# Patient Record
Sex: Female | Born: 1950
Health system: Southern US, Community
[De-identification: ages and names within clinical notes are randomized; demographics above are authoritative.]

## PROBLEM LIST (undated history)

## (undated) DIAGNOSIS — F40298 Other specified phobia: Secondary | ICD-10-CM

## (undated) DIAGNOSIS — D4701 Cutaneous mastocytosis: Secondary | ICD-10-CM

## (undated) DIAGNOSIS — C44722 Squamous cell carcinoma of skin of right lower limb, including hip: Secondary | ICD-10-CM

## (undated) HISTORY — DX: Other specified phobia: F40.298

## (undated) HISTORY — DX: Cutaneous mastocytosis: D47.01

## (undated) HISTORY — DX: Squamous cell carcinoma of skin of right lower limb, including hip: C44.722

---

## 2000-08-31 ENCOUNTER — Other Ambulatory Visit: Admission: RE | Admit: 2000-08-31 | Discharge: 2000-08-31 | Payer: Self-pay | Admitting: *Deleted

## 2002-01-04 ENCOUNTER — Other Ambulatory Visit: Admission: RE | Admit: 2002-01-04 | Discharge: 2002-01-04 | Payer: Self-pay | Admitting: *Deleted

## 2003-01-27 ENCOUNTER — Other Ambulatory Visit: Admission: RE | Admit: 2003-01-27 | Discharge: 2003-01-27 | Payer: Self-pay | Admitting: Family Medicine

## 2007-11-06 ENCOUNTER — Other Ambulatory Visit: Admission: RE | Admit: 2007-11-06 | Discharge: 2007-11-06 | Payer: Self-pay | Admitting: Obstetrics and Gynecology

## 2012-02-27 ENCOUNTER — Encounter: Payer: Self-pay | Admitting: Internal Medicine

## 2012-02-27 ENCOUNTER — Other Ambulatory Visit (INDEPENDENT_AMBULATORY_CARE_PROVIDER_SITE_OTHER): Payer: Self-pay

## 2012-02-27 ENCOUNTER — Ambulatory Visit (INDEPENDENT_AMBULATORY_CARE_PROVIDER_SITE_OTHER): Payer: Self-pay | Admitting: Internal Medicine

## 2012-02-27 VITALS — BP 120/84 | HR 61 | Temp 97.6°F | Resp 16 | Wt 113.0 lb

## 2012-02-27 DIAGNOSIS — Z23 Encounter for immunization: Secondary | ICD-10-CM

## 2012-02-27 DIAGNOSIS — Z1231 Encounter for screening mammogram for malignant neoplasm of breast: Secondary | ICD-10-CM | POA: Insufficient documentation

## 2012-02-27 DIAGNOSIS — Z0000 Encounter for general adult medical examination without abnormal findings: Secondary | ICD-10-CM | POA: Insufficient documentation

## 2012-02-27 DIAGNOSIS — IMO0002 Reserved for concepts with insufficient information to code with codable children: Secondary | ICD-10-CM

## 2012-02-27 LAB — COMPREHENSIVE METABOLIC PANEL
ALT: 13 U/L (ref 0–35)
Alkaline Phosphatase: 48 U/L (ref 39–117)
CO2: 30 mEq/L (ref 19–32)
Creatinine, Ser: 0.6 mg/dL (ref 0.4–1.2)
GFR: 113.32 mL/min (ref >60.00)
Total Bilirubin: 0.7 mg/dL (ref 0.3–1.2)

## 2012-02-27 LAB — CBC WITH DIFFERENTIAL/PLATELET
Basophils Absolute: 0 10*3/uL (ref 0.0–0.1)
Eosinophils Relative: 5.3 % — ABNORMAL HIGH (ref 0.0–5.0)
HCT: 39.4 % (ref 36.0–46.0)
Lymphocytes Relative: 22 % (ref 12.0–46.0)
Lymphs Abs: 1.3 10*3/uL (ref 0.7–4.0)
Monocytes Relative: 6.9 % (ref 3.0–12.0)
Neutrophils Relative %: 65 % (ref 43.0–77.0)
Platelets: 221 10*3/uL (ref 150.0–400.0)
RDW: 13.3 % (ref 11.5–14.6)
WBC: 6 10*3/uL (ref 4.5–10.5)

## 2012-02-27 LAB — LIPID PANEL
Cholesterol: 201 mg/dL — ABNORMAL HIGH (ref 0–200)
HDL: 88.5 mg/dL (ref >39.00)
Total CHOL/HDL Ratio: 2
Triglycerides: 37 mg/dL (ref 0.0–149.0)
VLDL: 7.4 mg/dL (ref 0.0–40.0)

## 2012-02-27 LAB — URINALYSIS, ROUTINE W REFLEX MICROSCOPIC
Bilirubin Urine: NEGATIVE
Leukocytes, UA: NEGATIVE
Nitrite: NEGATIVE
Total Protein, Urine: NEGATIVE
pH: 7 (ref 5.0–8.0)

## 2012-02-27 NOTE — Patient Instructions (Signed)
Preventive Care for Adults, Female A healthy lifestyle and preventive care can promote health and wellness. Preventive health guidelines for women include the following key practices.  A routine yearly physical is a good way to check with your caregiver about your health and preventive screening. It is a chance to share any concerns and updates on your health, and to receive a thorough exam.   Visit your dentist for a routine exam and preventive care every 6 months. Brush your teeth twice a day and floss once a day. Good oral hygiene prevents tooth decay and gum disease.   The frequency of eye exams is based on your age, health, family medical history, use of contact lenses, and other factors. Follow your caregiver's recommendations for frequency of eye exams.   Eat a healthy diet. Foods like vegetables, fruits, whole grains, low-fat dairy products, and lean protein foods contain the nutrients you need without too many calories. Decrease your intake of foods high in solid fats, added sugars, and salt. Eat the right amount of calories for you.Get information about a proper diet from your caregiver, if necessary.   Regular physical exercise is one of the most important things you can do for your health. Most adults should get at least 150 minutes of moderate-intensity exercise (any activity that increases your heart rate and causes you to sweat) each week. In addition, most adults need muscle-strengthening exercises on 2 or more days a week.   Maintain a healthy weight. The body mass index (BMI) is a screening tool to identify possible weight problems. It provides an estimate of body fat based on height and weight. Your caregiver can help determine your BMI, and can help you achieve or maintain a healthy weight.For adults 20 years and older:   A BMI below 18.5 is considered underweight.   A BMI of 18.5 to 24.9 is normal.   A BMI of 25 to 29.9 is considered overweight.   A BMI of 30 and above is  considered obese.   Maintain normal blood lipids and cholesterol levels by exercising and minimizing your intake of saturated fat. Eat a balanced diet with plenty of fruit and vegetables. Blood tests for lipids and cholesterol should begin at age 20 and be repeated every 5 years. If your lipid or cholesterol levels are high, you are over 50, or you are at high risk for heart disease, you may need your cholesterol levels checked more frequently.Ongoing high lipid and cholesterol levels should be treated with medicines if diet and exercise are not effective.   If you smoke, find out from your caregiver how to quit. If you do not use tobacco, do not start.   If you are pregnant, do not drink alcohol. If you are breastfeeding, be very cautious about drinking alcohol. If you are not pregnant and choose to drink alcohol, do not exceed 1 drink per day. One drink is considered to be 12 ounces (355 mL) of beer, 5 ounces (148 mL) of wine, or 1.5 ounces (44 mL) of liquor.   Avoid use of street drugs. Do not share needles with anyone. Ask for help if you need support or instructions about stopping the use of drugs.   High blood pressure causes heart disease and increases the risk of stroke. Your blood pressure should be checked at least every 1 to 2 years. Ongoing high blood pressure should be treated with medicines if weight loss and exercise are not effective.   If you are 55 to 59   years old, ask your caregiver if you should take aspirin to prevent strokes.   Diabetes screening involves taking a blood sample to check your fasting blood sugar level. This should be done once every 3 years, after age 45, if you are within normal weight and without risk factors for diabetes. Testing should be considered at a younger age or be carried out more frequently if you are overweight and have at least 1 risk factor for diabetes.   Breast cancer screening is essential preventive care for women. You should practice "breast  self-awareness." This means understanding the normal appearance and feel of your breasts and may include breast self-examination. Any changes detected, no matter how small, should be reported to a caregiver. Women in their 20s and 30s should have a clinical breast exam (CBE) by a caregiver as part of a regular health exam every 1 to 3 years. After age 40, women should have a CBE every year. Starting at age 40, women should consider having a mammography (breast X-ray test) every year. Women who have a family history of breast cancer should talk to their caregiver about genetic screening. Women at a high risk of breast cancer should talk to their caregivers about having magnetic resonance imaging (MRI) and a mammography every year.   The Pap test is a screening test for cervical cancer. A Pap test can show cell changes on the cervix that might become cervical cancer if left untreated. A Pap test is a procedure in which cells are obtained and examined from the lower end of the uterus (cervix).   Women should have a Pap test starting at age 21.   Between ages 21 and 29, Pap tests should be repeated every 2 years.   Beginning at age 30, you should have a Pap test every 3 years as long as the past 3 Pap tests have been normal.   Some women have medical problems that increase the chance of getting cervical cancer. Talk to your caregiver about these problems. It is especially important to talk to your caregiver if a new problem develops soon after your last Pap test. In these cases, your caregiver may recommend more frequent screening and Pap tests.   The above recommendations are the same for women who have or have not gotten the vaccine for human papillomavirus (HPV).   If you had a hysterectomy for a problem that was not cancer or a condition that could lead to cancer, then you no longer need Pap tests. Even if you no longer need a Pap test, a regular exam is a good idea to make sure no other problems are  starting.   If you are between ages 65 and 70, and you have had normal Pap tests going back 10 years, you no longer need Pap tests. Even if you no longer need a Pap test, a regular exam is a good idea to make sure no other problems are starting.   If you have had past treatment for cervical cancer or a condition that could lead to cancer, you need Pap tests and screening for cancer for at least 20 years after your treatment.   If Pap tests have been discontinued, risk factors (such as a new sexual partner) need to be reassessed to determine if screening should be resumed.   The HPV test is an additional test that may be used for cervical cancer screening. The HPV test looks for the virus that can cause the cell changes on the cervix.   The cells collected during the Pap test can be tested for HPV. The HPV test could be used to screen women aged 30 years and older, and should be used in women of any age who have unclear Pap test results. After the age of 30, women should have HPV testing at the same frequency as a Pap test.   Colorectal cancer can be detected and often prevented. Most routine colorectal cancer screening begins at the age of 50 and continues through age 75. However, your caregiver may recommend screening at an earlier age if you have risk factors for colon cancer. On a yearly basis, your caregiver may provide home test kits to check for hidden blood in the stool. Use of a small camera at the end of a tube, to directly examine the colon (sigmoidoscopy or colonoscopy), can detect the earliest forms of colorectal cancer. Talk to your caregiver about this at age 50, when routine screening begins. Direct examination of the colon should be repeated every 5 to 10 years through age 75, unless early forms of pre-cancerous polyps or small growths are found.   Hepatitis C blood testing is recommended for all people born from 1945 through 1965 and any individual with known risks for hepatitis C.    Practice safe sex. Use condoms and avoid high-risk sexual practices to reduce the spread of sexually transmitted infections (STIs). STIs include gonorrhea, chlamydia, syphilis, trichomonas, herpes, HPV, and human immunodeficiency virus (HIV). Herpes, HIV, and HPV are viral illnesses that have no cure. They can result in disability, cancer, and death. Sexually active women aged 25 and younger should be checked for chlamydia. Older women with new or multiple partners should also be tested for chlamydia. Testing for other STIs is recommended if you are sexually active and at increased risk.   Osteoporosis is a disease in which the bones lose minerals and strength with aging. This can result in serious bone fractures. The risk of osteoporosis can be identified using a bone density scan. Women ages 65 and over and women at risk for fractures or osteoporosis should discuss screening with their caregivers. Ask your caregiver whether you should take a calcium supplement or vitamin D to reduce the rate of osteoporosis.   Menopause can be associated with physical symptoms and risks. Hormone replacement therapy is available to decrease symptoms and risks. You should talk to your caregiver about whether hormone replacement therapy is right for you.   Use sunscreen with sun protection factor (SPF) of 30 or more. Apply sunscreen liberally and repeatedly throughout the day. You should seek shade when your shadow is shorter than you. Protect yourself by wearing long sleeves, pants, a wide-brimmed hat, and sunglasses year round, whenever you are outdoors.   Once a month, do a whole body skin exam, using a mirror to look at the skin on your back. Notify your caregiver of new moles, moles that have irregular borders, moles that are larger than a pencil eraser, or moles that have changed in shape or color.   Stay current with required immunizations.   Influenza. You need a dose every fall (or winter). The composition of  the flu vaccine changes each year, so being vaccinated once is not enough.   Pneumococcal polysaccharide. You need 1 to 2 doses if you smoke cigarettes or if you have certain chronic medical conditions. You need 1 dose at age 65 (or older) if you have never been vaccinated.   Tetanus, diphtheria, pertussis (Tdap, Td). Get 1 dose of   Tdap vaccine if you are younger than age 65, are over 65 and have contact with an infant, are a healthcare worker, are pregnant, or simply want to be protected from whooping cough. After that, you need a Td booster dose every 10 years. Consult your caregiver if you have not had at least 3 tetanus and diphtheria-containing shots sometime in your life or have a deep or dirty wound.   HPV. You need this vaccine if you are a woman age 26 or younger. The vaccine is given in 3 doses over 6 months.   Measles, mumps, rubella (MMR). You need at least 1 dose of MMR if you were born in 1957 or later. You may also need a second dose.   Meningococcal. If you are age 19 to 21 and a first-year college student living in a residence hall, or have one of several medical conditions, you need to get vaccinated against meningococcal disease. You may also need additional booster doses.   Zoster (shingles). If you are age 60 or older, you should get this vaccine.   Varicella (chickenpox). If you have never had chickenpox or you were vaccinated but received only 1 dose, talk to your caregiver to find out if you need this vaccine.   Hepatitis A. You need this vaccine if you have a specific risk factor for hepatitis A virus infection or you simply wish to be protected from this disease. The vaccine is usually given as 2 doses, 6 to 18 months apart.   Hepatitis B. You need this vaccine if you have a specific risk factor for hepatitis B virus infection or you simply wish to be protected from this disease. The vaccine is given in 3 doses, usually over 6 months.  Preventive Services /  Frequency Ages 19 to 39  Blood pressure check.** / Every 1 to 2 years.   Lipid and cholesterol check.** / Every 5 years beginning at age 20.   Clinical breast exam.** / Every 3 years for women in their 20s and 30s.   Pap test.** / Every 2 years from ages 21 through 29. Every 3 years starting at age 30 through age 65 or 70 with a history of 3 consecutive normal Pap tests.   HPV screening.** / Every 3 years from ages 30 through ages 65 to 70 with a history of 3 consecutive normal Pap tests.   Hepatitis C blood test.** / For any individual with known risks for hepatitis C.   Skin self-exam. / Monthly.   Influenza immunization.** / Every year.   Pneumococcal polysaccharide immunization.** / 1 to 2 doses if you smoke cigarettes or if you have certain chronic medical conditions.   Tetanus, diphtheria, pertussis (Tdap, Td) immunization. / A one-time dose of Tdap vaccine. After that, you need a Td booster dose every 10 years.   HPV immunization. / 3 doses over 6 months, if you are 26 and younger.   Measles, mumps, rubella (MMR) immunization. / You need at least 1 dose of MMR if you were born in 1957 or later. You may also need a second dose.   Meningococcal immunization. / 1 dose if you are age 19 to 21 and a first-year college student living in a residence hall, or have one of several medical conditions, you need to get vaccinated against meningococcal disease. You may also need additional booster doses.   Varicella immunization.** / Consult your caregiver.   Hepatitis A immunization.** / Consult your caregiver. 2 doses, 6 to 18 months   apart.   Hepatitis B immunization.** / Consult your caregiver. 3 doses usually over 6 months.  Ages 40 to 64  Blood pressure check.** / Every 1 to 2 years.   Lipid and cholesterol check.** / Every 5 years beginning at age 20.   Clinical breast exam.** / Every year after age 40.   Mammogram.** / Every year beginning at age 40 and continuing for as  long as you are in good health. Consult with your caregiver.   Pap test.** / Every 3 years starting at age 30 through age 65 or 70 with a history of 3 consecutive normal Pap tests.   HPV screening.** / Every 3 years from ages 30 through ages 65 to 70 with a history of 3 consecutive normal Pap tests.   Fecal occult blood test (FOBT) of stool. / Every year beginning at age 50 and continuing until age 75. You may not need to do this test if you get a colonoscopy every 10 years.   Flexible sigmoidoscopy or colonoscopy.** / Every 5 years for a flexible sigmoidoscopy or every 10 years for a colonoscopy beginning at age 50 and continuing until age 75.   Hepatitis C blood test.** / For all people born from 1945 through 1965 and any individual with known risks for hepatitis C.   Skin self-exam. / Monthly.   Influenza immunization.** / Every year.   Pneumococcal polysaccharide immunization.** / 1 to 2 doses if you smoke cigarettes or if you have certain chronic medical conditions.   Tetanus, diphtheria, pertussis (Tdap, Td) immunization.** / A one-time dose of Tdap vaccine. After that, you need a Td booster dose every 10 years.   Measles, mumps, rubella (MMR) immunization. / You need at least 1 dose of MMR if you were born in 1957 or later. You may also need a second dose.   Varicella immunization.** / Consult your caregiver.   Meningococcal immunization.** / Consult your caregiver.   Hepatitis A immunization.** / Consult your caregiver. 2 doses, 6 to 18 months apart.   Hepatitis B immunization.** / Consult your caregiver. 3 doses, usually over 6 months.  Ages 65 and over  Blood pressure check.** / Every 1 to 2 years.   Lipid and cholesterol check.** / Every 5 years beginning at age 20.   Clinical breast exam.** / Every year after age 40.   Mammogram.** / Every year beginning at age 40 and continuing for as long as you are in good health. Consult with your caregiver.   Pap test.** /  Every 3 years starting at age 30 through age 65 or 70 with a 3 consecutive normal Pap tests. Testing can be stopped between 65 and 70 with 3 consecutive normal Pap tests and no abnormal Pap or HPV tests in the past 10 years.   HPV screening.** / Every 3 years from ages 30 through ages 65 or 70 with a history of 3 consecutive normal Pap tests. Testing can be stopped between 65 and 70 with 3 consecutive normal Pap tests and no abnormal Pap or HPV tests in the past 10 years.   Fecal occult blood test (FOBT) of stool. / Every year beginning at age 50 and continuing until age 75. You may not need to do this test if you get a colonoscopy every 10 years.   Flexible sigmoidoscopy or colonoscopy.** / Every 5 years for a flexible sigmoidoscopy or every 10 years for a colonoscopy beginning at age 50 and continuing until age 75.   Hepatitis   C blood test.** / For all people born from 1945 through 1965 and any individual with known risks for hepatitis C.   Osteoporosis screening.** / A one-time screening for women ages 65 and over and women at risk for fractures or osteoporosis.   Skin self-exam. / Monthly.   Influenza immunization.** / Every year.   Pneumococcal polysaccharide immunization.** / 1 dose at age 65 (or older) if you have never been vaccinated.   Tetanus, diphtheria, pertussis (Tdap, Td) immunization. / A one-time dose of Tdap vaccine if you are over 65 and have contact with an infant, are a healthcare worker, or simply want to be protected from whooping cough. After that, you need a Td booster dose every 10 years.   Varicella immunization.** / Consult your caregiver.   Meningococcal immunization.** / Consult your caregiver.   Hepatitis A immunization.** / Consult your caregiver. 2 doses, 6 to 18 months apart.   Hepatitis B immunization.** / Check with your caregiver. 3 doses, usually over 6 months.  ** Family history and personal history of risk and conditions may change your caregiver's  recommendations. Document Released: 01/31/2002 Document Revised: 11/24/2011 Document Reviewed: 05/02/2011 [employer] Patient Information 2012 [employer]. 

## 2012-02-27 NOTE — Assessment & Plan Note (Signed)
Exam done, labs ordered, referred for mammogram and colonoscopy, vaccines were updated, pt ed material was given as well

## 2012-02-27 NOTE — Progress Notes (Signed)
  Subjective:    Patient ID: Erin Kim, female    DOB: Erin Kim, 61 y.o.   MRN: [id number]  HPI  New to me for a complete physical but her main complaint is that she has a cyst on her left index finger for nearly one year, she tells me that she saw a derm last summer and that it was drained but then recurred, 3 weeks ago she saw Dr. Darrelyn Hillock and he did xrays (results unknown to me) and he told her to treat it by "putting a band aid over it" but the cyst is bothersome and "gets in the way."  Review of Systems  Constitutional: Negative.   HENT: Negative.   Eyes: Negative.   Respiratory: Negative.   Cardiovascular: Negative.   Gastrointestinal: Negative.   Genitourinary: Negative.   Musculoskeletal: Negative.   Skin: Negative.   Neurological: Negative.   Hematological: Negative.   Psychiatric/Behavioral: Negative.        Objective:   Physical Exam  Vitals reviewed. Constitutional: She is oriented to person, place, and time. She appears well-developed and well-nourished. No distress.  HENT:  Head: Normocephalic and atraumatic.  Mouth/Throat: Oropharynx is clear and moist. No oropharyngeal exudate.  Eyes: Conjunctivae are normal. Right eye exhibits no discharge. Left eye exhibits no discharge. No scleral icterus.  Neck: Normal range of motion. Neck supple. No JVD present. No tracheal deviation present. No thyromegaly present.  Cardiovascular: Normal rate, regular rhythm, normal heart sounds and intact distal pulses.  Exam reveals no gallop and no friction rub.   No murmur heard. Pulmonary/Chest: Effort normal and breath sounds normal. No stridor. No respiratory distress. She has no wheezes. She has no rales. She exhibits no tenderness.  Abdominal: Soft. Bowel sounds are normal. She exhibits no distension and no mass. There is no tenderness. There is no rebound and no guarding.  Musculoskeletal: Normal range of motion. She exhibits no edema and no tenderness.        Hands: Lymphadenopathy:    She has no cervical adenopathy.  Neurological: She is oriented to person, place, and time.  Skin: Skin is warm and dry. No rash noted. She is not diaphoretic. No erythema. No pallor.  Psychiatric: She has a normal mood and affect. Her behavior is normal. Judgment and thought content normal.          Assessment & Plan:

## 2012-02-27 NOTE — Assessment & Plan Note (Signed)
She was referred to a hand surgeon

## 2012-02-29 ENCOUNTER — Encounter: Payer: Self-pay | Admitting: Internal Medicine

## 2012-02-29 ENCOUNTER — Encounter: Payer: Self-pay | Admitting: Gastroenterology

## 2012-03-14 ENCOUNTER — Ambulatory Visit (AMBULATORY_SURGERY_CENTER): Payer: Self-pay

## 2012-03-14 VITALS — Ht 62.0 in | Wt 113.0 lb

## 2012-03-14 DIAGNOSIS — Z1211 Encounter for screening for malignant neoplasm of colon: Secondary | ICD-10-CM

## 2012-03-14 DIAGNOSIS — Z800 Family history of malignant neoplasm of digestive organs: Secondary | ICD-10-CM

## 2012-03-14 MED ORDER — PEG-KCL-NACL-NASULF-NA ASC-C 100 G PO SOLR: 1.0000 | Freq: Once | ORAL | Status: DC

## 2012-03-23 ENCOUNTER — Ambulatory Visit (HOSPITAL_COMMUNITY): Payer: Self-pay

## 2012-03-26 LAB — HM COLONOSCOPY

## 2012-03-28 ENCOUNTER — Ambulatory Visit (AMBULATORY_SURGERY_CENTER): Payer: PRIVATE HEALTH INSURANCE | Admitting: Gastroenterology

## 2012-03-28 ENCOUNTER — Encounter: Payer: Self-pay | Admitting: Gastroenterology

## 2012-03-28 VITALS — BP 146/103 | HR 81 | Temp 96.0°F | Resp 24 | Ht 62.0 in | Wt 113.0 lb

## 2012-03-28 DIAGNOSIS — Z1211 Encounter for screening for malignant neoplasm of colon: Secondary | ICD-10-CM

## 2012-03-28 DIAGNOSIS — Z800 Family history of malignant neoplasm of digestive organs: Secondary | ICD-10-CM

## 2012-03-28 HISTORY — PX: COLONOSCOPY: SHX174

## 2012-03-28 MED ORDER — SODIUM CHLORIDE 0.9 % IV SOLN: 500.0000 mL | INTRAVENOUS | Status: DC

## 2012-03-28 NOTE — Op Note (Signed)
[facility] [address] [place], [place]  [address]  COLONOSCOPY PROCEDURE REPORT  PATIENT:  Erin Kim, Erin Kim  MR#:  [id number] BIRTHDATE:  05-10-1951, 60 yrs. old  GENDER:  female ENDOSCOPIST:  Vania Rea. Jarold Motto, MD, [facility] REF. BY:  Etta Grandchild, M.D. PROCEDURE DATE:  03/28/2012 PROCEDURE:  Higher-risk screening colonoscopy G0105  ASA CLASS:  Class I INDICATIONS:  family history of colon cancer MEDICATIONS:   propofol (Diprivan) 500 mg IV  DESCRIPTION OF PROCEDURE:   After the risks and benefits and of the procedure were explained, informed consent was obtained. Digital rectal exam was performed and revealed no abnormalities. The LB [id number] endoscope was introduced through the anus and advanced to the cecum, which was identified by both the appendix and ileocecal valve.  The quality of the prep was good, using MoviPrep.  The instrument was then slowly withdrawn as the colon was fully examined. <<PROCEDUREIMAGES>>  FINDINGS:  No polyps or cancers were seen.  This was otherwise a normal examination of the colon. VERY DIFFICULT EXAM PER REMARKEDLY LONG AND REDUNDANT COLON TO EXAMINE,,VERY DEEP PELVIC LOOPS,PEDIATRIC SCOPE USED,,,   Retroflexed views in the rectum revealed no abnormalities.    The scope was then withdrawn from the patient and the procedure completed.  COMPLICATIONS:  None ENDOSCOPIC IMPRESSION: 1) No polyps or cancers 2) Otherwise normal examination RECOMMENDATIONS: 1) Given your significant family history of colon cancer, you should have a repeat colonoscopy in 5 years  REPEAT EXAM:  No  ______________________________ Vania Rea. Jarold Motto, MD, [facility]  CC:  n. eSIGNED:   Vania Rea. Tahnee Cifuentes at 03/28/2012 10:53 AM  Nigel Mormon, [phone]

## 2012-03-28 NOTE — Progress Notes (Signed)
Patient did not experience any of the following events: a burn prior to discharge; a fall within the facility; wrong site/side/patient/procedure/implant event; or a hospital transfer or hospital admission upon discharge from the facility. (G8907) Patient did not have preoperative order for IV antibiotic SSI prophylaxis. (G8918)  

## 2012-03-28 NOTE — Patient Instructions (Signed)
YOU HAD AN ENDOSCOPIC PROCEDURE TODAY AT THE [facility]: Refer to the procedure report that was given to you for any specific questions about what was found during the examination.  If the procedure report does not answer your questions, please call your gastroenterologist to clarify.  If you requested that your care partner not be given the details of your procedure findings, then the procedure report has been included in a sealed envelope for you to review at your convenience later.  YOU SHOULD EXPECT: Some feelings of bloating in the abdomen. Passage of more gas than usual.  Walking can help get rid of the air that was put into your GI tract during the procedure and reduce the bloating. If you had a lower endoscopy (such as a colonoscopy or flexible sigmoidoscopy) you may notice spotting of blood in your stool or on the toilet paper. If you underwent a bowel prep for your procedure, then you may not have a normal bowel movement for a few days.  DIET: Your first meal following the procedure should be a light meal and then it is ok to progress to your normal diet.  A half-sandwich or bowl of soup is an example of a good first meal.  Heavy or fried foods are harder to digest and may make you feel nauseous or bloated.  Likewise meals heavy in dairy and vegetables can cause extra gas to form and this can also increase the bloating.  Drink plenty of fluids but you should avoid alcoholic beverages for 24 hours.  ACTIVITY: Your care partner should take you home directly after the procedure.  You should plan to take it easy, moving slowly for the rest of the day.  You can resume normal activity the day after the procedure however you should NOT DRIVE or use heavy machinery for 24 hours (because of the sedation medicines used during the test).    SYMPTOMS TO REPORT IMMEDIATELY: A gastroenterologist can be reached at any hour.  During normal business hours, 8:30 AM to 5:00 PM Monday through Friday,  call [phone].  After hours and on weekends, please call the GI answering service at [phone] who will take a message and have the physician on call contact you.   Following lower endoscopy (colonoscopy or flexible sigmoidoscopy):  Excessive amounts of blood in the stool  Significant tenderness or worsening of abdominal pains  Swelling of the abdomen that is new, acute  Fever of 100F or higher    FOLLOW UP: If any biopsies were taken you will be contacted by phone or by letter within the next 1-3 weeks.  Call your gastroenterologist if you have not heard about the biopsies in 3 weeks.  Our staff will call the home number listed on your records the next business day following your procedure to check on you and address any questions or concerns that you may have at that time regarding the information given to you following your procedure. This is a courtesy call and so if there is no answer at the home number and we have not heard from you through the emergency physician on call, we will assume that you have returned to your regular daily activities without incident.  SIGNATURES/CONFIDENTIALITY: You and/or your care partner have signed paperwork which will be entered into your electronic medical record.  These signatures attest to the fact that that the information above on your After Visit Summary has been reviewed and is understood.  Full responsibility of the confidentiality   of this discharge information lies with you and/or your care-partner.     

## 2012-03-28 NOTE — Progress Notes (Signed)
Difficulity reaching the cecum.  Pt was rolled supine.  Abdominal pressure by the tech and RN.  The cecum was reached.  Maw  The pt tolerated the colonoscopy very well. Maw

## 2012-03-29 ENCOUNTER — Telehealth: Payer: Self-pay

## 2012-03-29 NOTE — Telephone Encounter (Signed)
  Follow up Call-  Call back number 03/28/2012  Post procedure Call Back phone  # [phone]  Permission to leave phone message Yes     Patient questions:  Do you have a fever, pain , or abdominal swelling? no Pain Score  0 *  Have you tolerated food without any problems? yes  Have you been able to return to your normal activities? yes  Do you have any questions about your discharge instructions: Diet   no Medications  no Follow up visit  no  Do you have questions or concerns about your Care? no  Actions: * If pain score is 4 or above: No action needed, pain <4. Per the pt "everyone was so gracious, I was very fearful and will recommend [facility] to everyone". Maw

## 2012-04-16 ENCOUNTER — Ambulatory Visit (HOSPITAL_COMMUNITY)
Admission: RE | Admit: 2012-04-16 | Discharge: 2012-04-16 | Disposition: A | Discharge status: Home / Self Care | Payer: PRIVATE HEALTH INSURANCE | Source: Ambulatory Visit | Attending: Internal Medicine | Admitting: Internal Medicine

## 2012-04-16 DIAGNOSIS — Z1231 Encounter for screening mammogram for malignant neoplasm of breast: Secondary | ICD-10-CM | POA: Insufficient documentation

## 2012-04-18 LAB — HM MAMMOGRAPHY: HM Mammogram: NORMAL

## 2014-02-25 ENCOUNTER — Ambulatory Visit (INDEPENDENT_AMBULATORY_CARE_PROVIDER_SITE_OTHER): Payer: No Typology Code available for payment source | Admitting: Nurse Practitioner

## 2014-02-25 ENCOUNTER — Encounter: Payer: Self-pay | Admitting: Nurse Practitioner

## 2014-02-25 VITALS — BP 120/66 | HR 64 | Ht 63.0 in | Wt 109.0 lb

## 2014-02-25 DIAGNOSIS — N952 Postmenopausal atrophic vaginitis: Secondary | ICD-10-CM

## 2014-02-25 DIAGNOSIS — Z0000 Encounter for general adult medical examination without abnormal findings: Secondary | ICD-10-CM

## 2014-02-25 DIAGNOSIS — B3731 Acute candidiasis of vulva and vagina: Secondary | ICD-10-CM

## 2014-02-25 DIAGNOSIS — B373 Candidiasis of vulva and vagina: Secondary | ICD-10-CM

## 2014-02-25 DIAGNOSIS — Z01419 Encounter for gynecological examination (general) (routine) without abnormal findings: Secondary | ICD-10-CM

## 2014-02-25 LAB — POCT URINALYSIS DIPSTICK
BILIRUBIN UA: NEGATIVE
Blood, UA: NEGATIVE
Glucose, UA: NEGATIVE
KETONES UA: NEGATIVE
LEUKOCYTES UA: NEGATIVE
Nitrite, UA: NEGATIVE
PH UA: 6
PROTEIN UA: NEGATIVE
Urobilinogen, UA: NEGATIVE

## 2014-02-25 MED ORDER — FLUCONAZOLE 150 MG PO TABS: 150.0000 mg | ORAL_TABLET | Freq: Once | ORAL | Status: DC

## 2014-02-25 MED ORDER — ESTROGENS, CONJUGATED 0.625 MG/GM VA CREA: 1.0000 | TOPICAL_CREAM | Freq: Every day | VAGINAL | Status: DC

## 2014-02-25 NOTE — Progress Notes (Signed)
Patient ID: Erin Kim, female   DOB: 03-26-1951, 63 y.o.   MRN: [id number] 63 y.o. G50P2013 Married Caucasian Fe here for annual exam.  No vaso symptoms. Some increase in vaginal dryness.  Has used Premarin vaginal cream  In the past but is now out.  Wants to restart.  Using almond oil for lubrication but she feels this irritates here.  Patient's last menstrual period was 12/19/2001.          Sexually active: yes  The current method of family planning is post menopausal status.    Exercising: yes  Gym/ health club routine includes walking and zumba 2-3 times per week. Smoker:  no  Health Maintenance: Pap:  02/10/11, WNL MMG:  04/16/12, Bi-Rads 1: negative Colonoscopy:  03/28/12, normal, repeat in 5 years BMD:   2012 TDaP:  2013 Labs: HB: declined Urine: negative    reports that she quit smoking about 40 years ago. She has never used smokeless tobacco. She reports that she drinks about 8.4 ounces of alcohol per week. She reports that she does not use illicit drugs.  Past Medical History  Diagnosis Date  . Needle phobia     Past Surgical History  Procedure Laterality Date  . Cesarean section  1989    Current Outpatient Prescriptions  Medication Sig Dispense Refill  . Flaxseed, Linseed, (FLAX SEED OIL) 1000 MG CAPS Take 1 capsule by mouth daily.      . Multiple Vitamin (MULTIVITAMIN) tablet Take 1 tablet by mouth daily.       No current facility-administered medications for this visit.    Family History  Problem Relation Age of Onset  . Diabetes Mother   . Stroke Mother   . Hypertension Father   . Colon cancer Father   . Cancer Neg Hx   . Alcohol abuse Neg Hx   . Heart disease Neg Hx   . Hyperlipidemia Neg Hx   . Kidney disease Neg Hx   . Early death Neg Hx     ROS:  Pertinent items are noted in HPI.  Otherwise, a comprehensive ROS was negative.  Exam:   BP 120/66  Pulse 64  Ht 5\' 3"  (1.6 m)  Wt 109 lb (49.442 kg)  BMI 19.31 kg/m2  LMP 12/19/2001 Height: 5'  3" (160 cm)  Ht Readings from Last 3 Encounters:  02/25/14 5\' 3"  (1.6 m)  03/28/12 5\' 2"  (1.575 m)  03/14/12 5\' 2"  (1.575 m)    General appearance: alert, cooperative and appears stated age Head: Normocephalic, without obvious abnormality, atraumatic Neck: no adenopathy, supple, symmetrical, trachea midline and thyroid normal to inspection and palpation Lungs: clear to auscultation bilaterally Breasts: normal appearance, no masses or tenderness Heart: regular rate and rhythm Abdomen: soft, non-tender; no masses,  no organomegaly Extremities: extremities normal, atraumatic, no cyanosis or edema Skin: Skin color, texture, turgor normal. No rashes or lesions Lymph nodes: Cervical, supraclavicular, and axillary nodes normal. No abnormal inguinal nodes palpated Neurologic: Grossly normal   Pelvic: External genitalia:  no lesions              Urethra:  normal appearing urethra with no masses, tenderness or lesions              Bartholin's and Skene's: normal                 Vagina: very atrophic appearing vagina with pale color and discharge, no lesions  Cervix: anteverted              Pap taken: yes Bimanual Exam:  Uterus:  normal size, contour, position, consistency, mobility, non-tender              Adnexa: no mass, fullness, tenderness               Rectovaginal: Confirms               Anus:  normal sphincter tone, no lesions  A:  Well Woman with normal exam  Postmenopausal no HRT  Yeast external vaginitis  Atrophic vaginitis   P:   Pap smear as per guidelines   Mammogram due now and will schedule  Premarin Vaginal Cream 1`/2 gm three times weekly initially then reduce to twice weekly after 4-6 weeks.  Counseled with risk of CVA, DVT, cancer, etc.  Diflucan 150 mg X 2 given for yeast symptoms.  Recommend  OTC extra virgin olive oil prn  Counseled on breast self exam, mammography screening, adequate intake of calcium and vitamin D, diet and exercise return  annually or prn  An After Visit Summary was printed and given to the patient.

## 2014-02-25 NOTE — Patient Instructions (Addendum)

## 2014-02-27 LAB — IPS PAP TEST WITH HPV

## 2014-02-28 NOTE — Progress Notes (Signed)
Encounter reviewed by Dr. Savreen Gebhardt Silva.  

## 2014-03-04 ENCOUNTER — Ambulatory Visit: Payer: Self-pay | Admitting: Certified Nurse Midwife

## 2014-05-08 ENCOUNTER — Ambulatory Visit: Payer: PRIVATE HEALTH INSURANCE | Admitting: Internal Medicine

## 2014-06-02 ENCOUNTER — Telehealth: Payer: Self-pay | Admitting: Nurse Practitioner

## 2014-06-02 NOTE — Telephone Encounter (Signed)
Called the patient's husband back to explain claim remark code.

## 2014-06-02 NOTE — Telephone Encounter (Signed)
Patient's husband "Mikki Santee" is calling with a question about a bill they just received. He is questioning if the insurance was filed. Please call today if possible. He wants to get resolved today if possible. Release signed in chart, ok to talk with him.

## 2014-07-18 ENCOUNTER — Telehealth: Payer: Self-pay | Admitting: Nurse Practitioner

## 2014-07-18 NOTE — Telephone Encounter (Signed)
Left message to call Kaitlyn at [phone]. 

## 2014-07-18 NOTE — Telephone Encounter (Signed)
Patient calling re: She is visiting sister's newborn for a few days and her sister told her she needs  "whooping cough booster." Please advise.

## 2014-07-21 NOTE — Telephone Encounter (Signed)
Left message to call Kaitlyn at [phone]. 

## 2014-07-22 NOTE — Telephone Encounter (Signed)
Spoke with patient. Advised patient that she has received the Tdap vaccine in 2013. Advised the Tdap vaccine covers the pertussis. Advised that this vaccine already covers the pertussis portion and that she will not need another vaccine. Advised another tdap is due in 2023. Patient is agreeable and verbalizes understanding stating "My sisters husband said that I can't even come visit if I don't have it. Like I have to prove it or something." Patient states that she will talk with him about it and verbalizes understanding of vaccine received in 2013.  Routing to provider for final review. Patient agreeable to disposition. Will close encounter

## 2014-08-07 ENCOUNTER — Telehealth: Payer: Self-pay | Admitting: Nurse Practitioner

## 2014-08-07 NOTE — Telephone Encounter (Signed)
Patient's husband "Mikki Santee " is asking for Janett Billow to call today if she has a moment. He is asking if she has heard from his insurance regarding his wife's claim. He wants to know if you have talked with [employer] before he calls pathology regarding her claim. He says [employer] has paid partial on her pathology. He just wants an update, he says he really wants to get this resolved so that we can get payment.

## 2014-10-20 ENCOUNTER — Encounter: Payer: Self-pay | Admitting: Nurse Practitioner

## 2014-11-26 ENCOUNTER — Telehealth: Payer: Self-pay | Admitting: Nurse Practitioner

## 2014-11-26 NOTE — Telephone Encounter (Signed)
Please call regarding recent invoice .  ROI on file to talk with spouse "Mikki Santee'.

## 2015-03-02 ENCOUNTER — Ambulatory Visit: Payer: No Typology Code available for payment source | Admitting: Nurse Practitioner

## 2015-03-09 ENCOUNTER — Encounter: Payer: Self-pay | Admitting: Nurse Practitioner

## 2015-03-09 ENCOUNTER — Ambulatory Visit (INDEPENDENT_AMBULATORY_CARE_PROVIDER_SITE_OTHER): Payer: No Typology Code available for payment source | Admitting: Nurse Practitioner

## 2015-03-09 VITALS — BP 126/64 | HR 84 | Ht 63.0 in | Wt 112.0 lb

## 2015-03-09 DIAGNOSIS — Z0000 Encounter for general adult medical examination without abnormal findings: Secondary | ICD-10-CM | POA: Diagnosis not present

## 2015-03-09 DIAGNOSIS — Z01419 Encounter for gynecological examination (general) (routine) without abnormal findings: Secondary | ICD-10-CM | POA: Diagnosis not present

## 2015-03-09 LAB — POCT URINALYSIS DIPSTICK
BILIRUBIN UA: NEGATIVE
GLUCOSE UA: NEGATIVE
Ketones, UA: NEGATIVE
Nitrite, UA: NEGATIVE
PH UA: 6.5
PROTEIN UA: NEGATIVE
Urobilinogen, UA: NEGATIVE

## 2015-03-09 LAB — URINALYSIS, MICROSCOPIC ONLY
CASTS: NONE SEEN
Crystals: NONE SEEN

## 2015-03-09 LAB — HEMOGLOBIN, FINGERSTICK: Hemoglobin, fingerstick: 13.3 g/dL (ref 12.0–16.0)

## 2015-03-09 MED ORDER — ESTROGENS, CONJUGATED 0.625 MG/GM VA CREA: 1.0000 | TOPICAL_CREAM | Freq: Every day | VAGINAL | Status: DC

## 2015-03-09 NOTE — Patient Instructions (Signed)

## 2015-03-09 NOTE — Progress Notes (Signed)
Patient ID: Erin Kim, female   DOB: [birth year], 64 y.o.   MRN: [id number] 64 y.o. G48P2013 Married  Caucasian Fe here for annual exam.  No vaginal bleeding, some dryness.  Using coconut oil.  No other new diagnosis.  Patient's last menstrual period was 12/19/2001.          Sexually active: Yes.    The current method of family planning is post menopausal status.    Exercising: Yes.    walking, zumba and lightweights daily Smoker:  no  Health Maintenance: Pap:  02/25/14, negative with neg HR HPV MMG:  04/16/12, Bi-Rads 1:  Negative will schedule Colonoscopy:  03/28/12, normal, repeat in 5 years BMD:   Never Will schedule TDaP:  2013 Labs:  HB:  13.3  Urine:  Trace leuk's, trace RBC   reports that she quit smoking about 41 years ago. She has never used smokeless tobacco. She reports that she drinks about 3.6 oz of alcohol per week. She reports that she does not use illicit drugs.  Past Medical History  Diagnosis Date  . Needle phobia     Past Surgical History  Procedure Laterality Date  . Cesarean section  1989    Current Outpatient Prescriptions  Medication Sig Dispense Refill  . conjugated estrogens (PREMARIN) vaginal cream Place 1 Applicatorful vaginally daily. Use 1/2 g vaginally three times weekly 60 g 3  . Flaxseed, Linseed, (FLAX SEED OIL) 1000 MG CAPS Take 1 capsule by mouth daily.     No current facility-administered medications for this visit.    Family History  Problem Relation Age of Onset  . Diabetes Mother   . Stroke Mother   . Hypertension Father   . Colon cancer Father   . Cancer Neg Hx   . Alcohol abuse Neg Hx   . Heart disease Neg Hx   . Hyperlipidemia Neg Hx   . Kidney disease Neg Hx   . Early death Neg Hx     ROS:  Pertinent items are noted in HPI.  Otherwise, a comprehensive ROS was negative.  Exam:   BP 126/64 mmHg  Pulse 84  Ht 5\' 3"  (1.6 m)  Wt 112 lb (50.803 kg)  BMI 19.84 kg/m2  LMP 12/19/2001 Height: 5\' 3"  (160 cm) Ht Readings  from Last 3 Encounters:  03/09/15 5\' 3"  (1.6 m)  02/25/14 5\' 3"  (1.6 m)  03/28/12 5\' 2"  (1.575 m)    General appearance: alert, cooperative and appears stated age Head: Normocephalic, without obvious abnormality, atraumatic Neck: no adenopathy, supple, symmetrical, trachea midline and thyroid normal to inspection and palpation Lungs: clear to auscultation bilaterally Breasts: normal appearance, no masses or tenderness Heart: regular rate and rhythm Abdomen: soft, non-tender; no masses,  no organomegaly Extremities: extremities normal, atraumatic, no cyanosis or edema Skin: Skin color, texture, turgor normal. No rashes or lesions Lymph nodes: Cervical, supraclavicular, and axillary nodes normal. No abnormal inguinal nodes palpated Neurologic: Grossly normal   Pelvic: External genitalia:  no lesions              Urethra:  normal appearing urethra with no masses, tenderness or lesions              Bartholin's and Skene's: normal                 Vagina: normal appearing vagina with normal color and discharge, no lesions              Cervix: anteverted  Pap taken: No. Bimanual Exam:  Uterus:  normal size, contour, position, consistency, mobility, non-tender              Adnexa: no mass, fullness, tenderness               Rectovaginal: Confirms               Anus:  normal sphincter tone, no lesions  Chaperone present:  no  A:  Well Woman with normal exam  Postmenopausal no HRT psoriatic  external vaginitis Atrophic vaginitis - currently off vaginal estrogen  R/O UTI  P:   Reviewed health and wellness pertinent to exam  Pap smear not taken today  Mammogram is past due and will schedule  Premarin will not be refilled until Mammo is done  Order for BMD is sent to [facility].  Counseled on breast self exam, mammography screening, use and side effects of HRT, adequate intake of calcium and vitamin D, diet and exercise return annually or prn  An After  Visit Summary was printed and given to the patient.

## 2015-03-10 ENCOUNTER — Other Ambulatory Visit: Payer: Self-pay | Admitting: Nurse Practitioner

## 2015-03-10 LAB — URINE CULTURE

## 2015-03-10 MED ORDER — AMOXICILLIN 500 MG PO CAPS: 500.0000 mg | ORAL_CAPSULE | Freq: Three times a day (TID) | ORAL | Status: DC

## 2015-03-12 ENCOUNTER — Telehealth: Payer: Self-pay | Admitting: *Deleted

## 2015-03-12 NOTE — Progress Notes (Signed)
Encounter reviewed by Dr. Brook Silva.  

## 2015-03-12 NOTE — Telephone Encounter (Signed)
have attempted to contact this patient by phone with the following results: left message to return call to [place] at [phone] answering machine (home and cell). No personal information given. [phone] (Home) *Preferred*, [phone] (Mobile)

## 2015-03-12 NOTE — Telephone Encounter (Signed)
Pt notified in result note.  Closing encounter. 

## 2015-03-12 NOTE — Telephone Encounter (Signed)
-----   Message from Antonietta Barcelona, [place] sent at 03/10/2015  4:06 PM EDT ----- Please let pt. kno wthat urine shows group B strep.  She needs Amoxil 500 mg TID for 10 days.   RX put in Epic.

## 2015-03-16 ENCOUNTER — Encounter: Payer: Self-pay | Admitting: Internal Medicine

## 2015-03-16 ENCOUNTER — Ambulatory Visit (INDEPENDENT_AMBULATORY_CARE_PROVIDER_SITE_OTHER): Payer: PRIVATE HEALTH INSURANCE | Admitting: Internal Medicine

## 2015-03-16 ENCOUNTER — Encounter (INDEPENDENT_AMBULATORY_CARE_PROVIDER_SITE_OTHER): Payer: PRIVATE HEALTH INSURANCE

## 2015-03-16 VITALS — BP 128/76 | HR 72 | Temp 98.5°F | Resp 16 | Ht 63.0 in | Wt 112.0 lb

## 2015-03-16 DIAGNOSIS — Z1231 Encounter for screening mammogram for malignant neoplasm of breast: Secondary | ICD-10-CM

## 2015-03-16 DIAGNOSIS — Z23 Encounter for immunization: Secondary | ICD-10-CM

## 2015-03-16 DIAGNOSIS — Z0000 Encounter for general adult medical examination without abnormal findings: Secondary | ICD-10-CM | POA: Diagnosis not present

## 2015-03-16 DIAGNOSIS — M8580 Other specified disorders of bone density and structure, unspecified site: Secondary | ICD-10-CM

## 2015-03-16 DIAGNOSIS — I781 Nevus, non-neoplastic: Secondary | ICD-10-CM

## 2015-03-16 LAB — CBC WITH DIFFERENTIAL/PLATELET
BASOS PCT: 0.8 % (ref 0.0–3.0)
Basophils Absolute: 0.1 10*3/uL (ref 0.0–0.1)
EOS PCT: 4.3 % (ref 0.0–5.0)
Eosinophils Absolute: 0.3 10*3/uL (ref 0.0–0.7)
HEMATOCRIT: 40.4 % (ref 36.0–46.0)
Hemoglobin: 13.5 g/dL (ref 12.0–15.0)
LYMPHS PCT: 26.6 % (ref 12.0–46.0)
Lymphs Abs: 1.8 10*3/uL (ref 0.7–4.0)
MCHC: 33.5 g/dL (ref 30.0–36.0)
MCV: 89.4 fl (ref 78.0–100.0)
Monocytes Absolute: 0.6 10*3/uL (ref 0.1–1.0)
Monocytes Relative: 8.5 % (ref 3.0–12.0)
NEUTROS PCT: 59.8 % (ref 43.0–77.0)
Neutro Abs: 4 10*3/uL (ref 1.4–7.7)
PLATELETS: 217 10*3/uL (ref 150.0–400.0)
RBC: 4.52 Mil/uL (ref 3.87–5.11)
RDW: 13.5 % (ref 11.5–15.5)
WBC: 6.7 10*3/uL (ref 4.0–10.5)

## 2015-03-16 LAB — LIPID PANEL
CHOL/HDL RATIO: 2
CHOLESTEROL: 185 mg/dL (ref 0–200)
HDL: 82.9 mg/dL (ref >39.00)
LDL CALC: 89 mg/dL (ref 0–99)
NonHDL: 102.1
Triglycerides: 68 mg/dL (ref 0.0–149.0)
VLDL: 13.6 mg/dL (ref 0.0–40.0)

## 2015-03-16 LAB — COMPREHENSIVE METABOLIC PANEL
ALBUMIN: 4.1 g/dL (ref 3.5–5.2)
ALK PHOS: 45 U/L (ref 39–117)
ALT: 20 U/L (ref 0–35)
AST: 20 U/L (ref 0–37)
BUN: 10 mg/dL (ref 6–23)
CO2: 32 mEq/L (ref 19–32)
Calcium: 9.2 mg/dL (ref 8.4–10.5)
Chloride: 102 mEq/L (ref 96–112)
Creatinine, Ser: 0.7 mg/dL (ref 0.40–1.20)
GFR: 90.27 mL/min (ref >60.00)
GLUCOSE: 97 mg/dL (ref 70–99)
Potassium: 4.1 mEq/L (ref 3.5–5.1)
Sodium: 136 mEq/L (ref 135–145)
Total Bilirubin: 0.8 mg/dL (ref 0.2–1.2)
Total Protein: 6.9 g/dL (ref 6.0–8.3)

## 2015-03-16 LAB — TSH: TSH: 1.59 u[IU]/mL (ref 0.35–4.50)

## 2015-03-16 NOTE — Progress Notes (Signed)
Pre visit review using our clinic review tool, if applicable. No additional management support is needed unless otherwise documented below in the visit note. 

## 2015-03-16 NOTE — Patient Instructions (Signed)
Preventive Care for Adults A healthy lifestyle and preventive care can promote health and wellness. Preventive health guidelines for women include the following key practices.  A routine yearly physical is a good way to check with your health care provider about your health and preventive screening. It is a chance to share any concerns and updates on your health and to receive a thorough exam.  Visit your dentist for a routine exam and preventive care every 6 months. Brush your teeth twice a day and floss once a day. Good oral hygiene prevents tooth decay and gum disease.  The frequency of eye exams is based on your age, health, family medical history, use of contact lenses, and other factors. Follow your health care provider's recommendations for frequency of eye exams.  Eat a healthy diet. Foods like vegetables, fruits, whole grains, low-fat dairy products, and lean protein foods contain the nutrients you need without too many calories. Decrease your intake of foods high in solid fats, added sugars, and salt. Eat the right amount of calories for you.Get information about a proper diet from your health care provider, if necessary.  Regular physical exercise is one of the most important things you can do for your health. Most adults should get at least 150 minutes of moderate-intensity exercise (any activity that increases your heart rate and causes you to sweat) each week. In addition, most adults need muscle-strengthening exercises on 2 or more days a week.  Maintain a healthy weight. The body mass index (BMI) is a screening tool to identify possible weight problems. It provides an estimate of body fat based on height and weight. Your health care provider can find your BMI and can help you achieve or maintain a healthy weight.For adults 20 years and older:  A BMI below 18.5 is considered underweight.  A BMI of 18.5 to 24.9 is normal.  A BMI of 25 to 29.9 is considered overweight.  A BMI of  30 and above is considered obese.  Maintain normal blood lipids and cholesterol levels by exercising and minimizing your intake of saturated fat. Eat a balanced diet with plenty of fruit and vegetables. Blood tests for lipids and cholesterol should begin at age 76 and be repeated every 5 years. If your lipid or cholesterol levels are high, you are over 50, or you are at high risk for heart disease, you may need your cholesterol levels checked more frequently.Ongoing high lipid and cholesterol levels should be treated with medicines if diet and exercise are not working.  If you smoke, find out from your health care provider how to quit. If you do not use tobacco, do not start.  Lung cancer screening is recommended for adults aged 22-80 years who are at high risk for developing lung cancer because of a history of smoking. A yearly low-dose CT scan of the lungs is recommended for people who have at least a 30-pack-year history of smoking and are a current smoker or have quit within the past 15 years. A pack year of smoking is smoking an average of 1 pack of cigarettes a day for 1 year (for example: 1 pack a day for 30 years or 2 packs a day for 15 years). Yearly screening should continue until the smoker has stopped smoking for at least 15 years. Yearly screening should be stopped for people who develop a health problem that would prevent them from having lung cancer treatment.  If you are pregnant, do not drink alcohol. If you are breastfeeding,  be very cautious about drinking alcohol. If you are not pregnant and choose to drink alcohol, do not have more than 1 drink per day. One drink is considered to be 12 ounces (355 mL) of beer, 5 ounces (148 mL) of wine, or 1.5 ounces (44 mL) of liquor.  Avoid use of street drugs. Do not share needles with anyone. Ask for help if you need support or instructions about stopping the use of drugs.  High blood pressure causes heart disease and increases the risk of  stroke. Your blood pressure should be checked at least every 1 to 2 years. Ongoing high blood pressure should be treated with medicines if weight loss and exercise do not work.  If you are 75-52 years old, ask your health care provider if you should take aspirin to prevent strokes.  Diabetes screening involves taking a blood sample to check your fasting blood sugar level. This should be done once every 3 years, after age 15, if you are within normal weight and without risk factors for diabetes. Testing should be considered at a younger age or be carried out more frequently if you are overweight and have at least 1 risk factor for diabetes.  Breast cancer screening is essential preventive care for women. You should practice "breast self-awareness." This means understanding the normal appearance and feel of your breasts and may include breast self-examination. Any changes detected, no matter how small, should be reported to a health care provider. Women in their 58s and 30s should have a clinical breast exam (CBE) by a health care provider as part of a regular health exam every 1 to 3 years. After age 16, women should have a CBE every year. Starting at age 53, women should consider having a mammogram (breast X-ray test) every year. Women who have a family history of breast cancer should talk to their health care provider about genetic screening. Women at a high risk of breast cancer should talk to their health care providers about having an MRI and a mammogram every year.  Breast cancer gene (BRCA)-related cancer risk assessment is recommended for women who have family members with BRCA-related cancers. BRCA-related cancers include breast, ovarian, tubal, and peritoneal cancers. Having family members with these cancers may be associated with an increased risk for harmful changes (mutations) in the breast cancer genes BRCA1 and BRCA2. Results of the assessment will determine the need for genetic counseling and  BRCA1 and BRCA2 testing.  Routine pelvic exams to screen for cancer are no longer recommended for nonpregnant women who are considered low risk for cancer of the pelvic organs (ovaries, uterus, and vagina) and who do not have symptoms. Ask your health care provider if a screening pelvic exam is right for you.  If you have had past treatment for cervical cancer or a condition that could lead to cancer, you need Pap tests and screening for cancer for at least 20 years after your treatment. If Pap tests have been discontinued, your risk factors (such as having a new sexual partner) need to be reassessed to determine if screening should be resumed. Some women have medical problems that increase the chance of getting cervical cancer. In these cases, your health care provider may recommend more frequent screening and Pap tests.  The HPV test is an additional test that may be used for cervical cancer screening. The HPV test looks for the virus that can cause the cell changes on the cervix. The cells collected during the Pap test can be  tested for HPV. The HPV test could be used to screen women aged 30 years and older, and should be used in women of any age who have unclear Pap test results. After the age of 30, women should have HPV testing at the same frequency as a Pap test.  Colorectal cancer can be detected and often prevented. Most routine colorectal cancer screening begins at the age of 50 years and continues through age 75 years. However, your health care provider may recommend screening at an earlier age if you have risk factors for colon cancer. On a yearly basis, your health care provider may provide home test kits to check for hidden blood in the stool. Use of a small camera at the end of a tube, to directly examine the colon (sigmoidoscopy or colonoscopy), can detect the earliest forms of colorectal cancer. Talk to your health care provider about this at age 50, when routine screening begins. Direct  exam of the colon should be repeated every 5-10 years through age 75 years, unless early forms of pre-cancerous polyps or small growths are found.  People who are at an increased risk for hepatitis B should be screened for this virus. You are considered at high risk for hepatitis B if:  You were born in a country where hepatitis B occurs often. Talk with your health care provider about which countries are considered high risk.  Your parents were born in a high-risk country and you have not received a shot to protect against hepatitis B (hepatitis B vaccine).  You have HIV or AIDS.  You use needles to inject street drugs.  You live with, or have sex with, someone who has hepatitis B.  You get hemodialysis treatment.  You take certain medicines for conditions like cancer, organ transplantation, and autoimmune conditions.  Hepatitis C blood testing is recommended for all people born from 1945 through 1965 and any individual with known risks for hepatitis C.  Practice safe sex. Use condoms and avoid high-risk sexual practices to reduce the spread of sexually transmitted infections (STIs). STIs include gonorrhea, chlamydia, syphilis, trichomonas, herpes, HPV, and human immunodeficiency virus (HIV). Herpes, HIV, and HPV are viral illnesses that have no cure. They can result in disability, cancer, and death.  You should be screened for sexually transmitted illnesses (STIs) including gonorrhea and chlamydia if:  You are sexually active and are younger than 24 years.  You are older than 24 years and your health care provider tells you that you are at risk for this type of infection.  Your sexual activity has changed since you were last screened and you are at an increased risk for chlamydia or gonorrhea. Ask your health care provider if you are at risk.  If you are at risk of being infected with HIV, it is recommended that you take a prescription medicine daily to prevent HIV infection. This is  called preexposure prophylaxis (PrEP). You are considered at risk if:  You are a heterosexual woman, are sexually active, and are at increased risk for HIV infection.  You take drugs by injection.  You are sexually active with a partner who has HIV.  Talk with your health care provider about whether you are at high risk of being infected with HIV. If you choose to begin PrEP, you should first be tested for HIV. You should then be tested every 3 months for as long as you are taking PrEP.  Osteoporosis is a disease in which the bones lose minerals and strength   with aging. This can result in serious bone fractures or breaks. The risk of osteoporosis can be identified using a bone density scan. Women ages 65 years and over and women at risk for fractures or osteoporosis should discuss screening with their health care providers. Ask your health care provider whether you should take a calcium supplement or vitamin D to reduce the rate of osteoporosis.  Menopause can be associated with physical symptoms and risks. Hormone replacement therapy is available to decrease symptoms and risks. You should talk to your health care provider about whether hormone replacement therapy is right for you.  Use sunscreen. Apply sunscreen liberally and repeatedly throughout the day. You should seek shade when your shadow is shorter than you. Protect yourself by wearing long sleeves, pants, a wide-brimmed hat, and sunglasses year round, whenever you are outdoors.  Once a month, do a whole body skin exam, using a mirror to look at the skin on your back. Tell your health care provider of new moles, moles that have irregular borders, moles that are larger than a pencil eraser, or moles that have changed in shape or color.  Stay current with required vaccines (immunizations).  Influenza vaccine. All adults should be immunized every year.  Tetanus, diphtheria, and acellular pertussis (Td, Tdap) vaccine. Pregnant women should  receive 1 dose of Tdap vaccine during each pregnancy. The dose should be obtained regardless of the length of time since the last dose. Immunization is preferred during the 27th-36th week of gestation. An adult who has not previously received Tdap or who does not know her vaccine status should receive 1 dose of Tdap. This initial dose should be followed by tetanus and diphtheria toxoids (Td) booster doses every 10 years. Adults with an unknown or incomplete history of completing a 3-dose immunization series with Td-containing vaccines should begin or complete a primary immunization series including a Tdap dose. Adults should receive a Td booster every 10 years.  Varicella vaccine. An adult without evidence of immunity to varicella should receive 2 doses or a second dose if she has previously received 1 dose. Pregnant females who do not have evidence of immunity should receive the first dose after pregnancy. This first dose should be obtained before leaving the health care facility. The second dose should be obtained 4-8 weeks after the first dose.  Human papillomavirus (HPV) vaccine. Females aged 13-26 years who have not received the vaccine previously should obtain the 3-dose series. The vaccine is not recommended for use in pregnant females. However, pregnancy testing is not needed before receiving a dose. If a female is found to be pregnant after receiving a dose, no treatment is needed. In that case, the remaining doses should be delayed until after the pregnancy. Immunization is recommended for any person with an immunocompromised condition through the age of 26 years if she did not get any or all doses earlier. During the 3-dose series, the second dose should be obtained 4-8 weeks after the first dose. The third dose should be obtained 24 weeks after the first dose and 16 weeks after the second dose.  Zoster vaccine. One dose is recommended for adults aged 60 years or older unless certain conditions are  present.  Measles, mumps, and rubella (MMR) vaccine. Adults born before 1957 generally are considered immune to measles and mumps. Adults born in 1957 or later should have 1 or more doses of MMR vaccine unless there is a contraindication to the vaccine or there is laboratory evidence of immunity to   each of the three diseases. A routine second dose of MMR vaccine should be obtained at least 28 days after the first dose for students attending postsecondary schools, health care workers, or international travelers. People who received inactivated measles vaccine or an unknown type of measles vaccine during 1963-1967 should receive 2 doses of MMR vaccine. People who received inactivated mumps vaccine or an unknown type of mumps vaccine before 1979 and are at high risk for mumps infection should consider immunization with 2 doses of MMR vaccine. For females of childbearing age, rubella immunity should be determined. If there is no evidence of immunity, females who are not pregnant should be vaccinated. If there is no evidence of immunity, females who are pregnant should delay immunization until after pregnancy. Unvaccinated health care workers born before 1957 who lack laboratory evidence of measles, mumps, or rubella immunity or laboratory confirmation of disease should consider measles and mumps immunization with 2 doses of MMR vaccine or rubella immunization with 1 dose of MMR vaccine.  Pneumococcal 13-valent conjugate (PCV13) vaccine. When indicated, a person who is uncertain of her immunization history and has no record of immunization should receive the PCV13 vaccine. An adult aged 19 years or older who has certain medical conditions and has not been previously immunized should receive 1 dose of PCV13 vaccine. This PCV13 should be followed with a dose of pneumococcal polysaccharide (PPSV23) vaccine. The PPSV23 vaccine dose should be obtained at least 8 weeks after the dose of PCV13 vaccine. An adult aged 19  years or older who has certain medical conditions and previously received 1 or more doses of PPSV23 vaccine should receive 1 dose of PCV13. The PCV13 vaccine dose should be obtained 1 or more years after the last PPSV23 vaccine dose.  Pneumococcal polysaccharide (PPSV23) vaccine. When PCV13 is also indicated, PCV13 should be obtained first. All adults aged 65 years and older should be immunized. An adult younger than age 65 years who has certain medical conditions should be immunized. Any person who resides in a nursing home or long-term care facility should be immunized. An adult smoker should be immunized. People with an immunocompromised condition and certain other conditions should receive both PCV13 and PPSV23 vaccines. People with human immunodeficiency virus (HIV) infection should be immunized as soon as possible after diagnosis. Immunization during chemotherapy or radiation therapy should be avoided. Routine use of PPSV23 vaccine is not recommended for American Indians, [place] Natives, or people younger than 65 years unless there are medical conditions that require PPSV23 vaccine. When indicated, people who have unknown immunization and have no record of immunization should receive PPSV23 vaccine. One-time revaccination 5 years after the first dose of PPSV23 is recommended for people aged 19-64 years who have chronic kidney failure, nephrotic syndrome, asplenia, or immunocompromised conditions. People who received 1-2 doses of PPSV23 before age 65 years should receive another dose of PPSV23 vaccine at age 65 years or later if at least 5 years have passed since the previous dose. Doses of PPSV23 are not needed for people immunized with PPSV23 at or after age 65 years.  Meningococcal vaccine. Adults with asplenia or persistent complement component deficiencies should receive 2 doses of quadrivalent meningococcal conjugate (MenACWY-D) vaccine. The doses should be obtained at least 2 months apart.  Microbiologists working with certain meningococcal bacteria, military recruits, people at risk during an outbreak, and people who travel to or live in countries with a high rate of meningitis should be immunized. A first-year college student up through age   21 years who is living in a residence hall should receive a dose if she did not receive a dose on or after her 16th birthday. Adults who have certain high-risk conditions should receive one or more doses of vaccine.  Hepatitis A vaccine. Adults who wish to be protected from this disease, have certain high-risk conditions, work with hepatitis A-infected animals, work in hepatitis A research labs, or travel to or work in countries with a high rate of hepatitis A should be immunized. Adults who were previously unvaccinated and who anticipate close contact with an international adoptee during the first 60 days after arrival in the [place] from a country with a high rate of hepatitis A should be immunized.  Hepatitis B vaccine. Adults who wish to be protected from this disease, have certain high-risk conditions, may be exposed to blood or other infectious body fluids, are household contacts or sex partners of hepatitis B positive people, are clients or workers in certain care facilities, or travel to or work in countries with a high rate of hepatitis B should be immunized.  Haemophilus influenzae type b (Hib) vaccine. A previously unvaccinated person with asplenia or sickle cell disease or having a scheduled splenectomy should receive 1 dose of Hib vaccine. Regardless of previous immunization, a recipient of a hematopoietic stem cell transplant should receive a 3-dose series 6-12 months after her successful transplant. Hib vaccine is not recommended for adults with HIV infection. Preventive Services / Frequency Ages 64 to 68 years  Blood pressure check.** / Every 1 to 2 years.  Lipid and cholesterol check.** / Every 5 years beginning at age  22.  Clinical breast exam.** / Every 3 years for women in their 88s and 53s.  BRCA-related cancer risk assessment.** / For women who have family members with a BRCA-related cancer (breast, ovarian, tubal, or peritoneal cancers).  Pap test.** / Every 2 years from ages 90 through 51. Every 3 years starting at age 21 through age 56 or 3 with a history of 3 consecutive normal Pap tests.  HPV screening.** / Every 3 years from ages 24 through ages 1 to 46 with a history of 3 consecutive normal Pap tests.  Hepatitis C blood test.** / For any individual with known risks for hepatitis C.  Skin self-exam. / Monthly.  Influenza vaccine. / Every year.  Tetanus, diphtheria, and acellular pertussis (Tdap, Td) vaccine.** / Consult your health care provider. Pregnant women should receive 1 dose of Tdap vaccine during each pregnancy. 1 dose of Td every 10 years.  Varicella vaccine.** / Consult your health care provider. Pregnant females who do not have evidence of immunity should receive the first dose after pregnancy.  HPV vaccine. / 3 doses over 6 months, if 72 and younger. The vaccine is not recommended for use in pregnant females. However, pregnancy testing is not needed before receiving a dose.  Measles, mumps, rubella (MMR) vaccine.** / You need at least 1 dose of MMR if you were born in 1957 or later. You may also need a 2nd dose. For females of childbearing age, rubella immunity should be determined. If there is no evidence of immunity, females who are not pregnant should be vaccinated. If there is no evidence of immunity, females who are pregnant should delay immunization until after pregnancy.  Pneumococcal 13-valent conjugate (PCV13) vaccine.** / Consult your health care provider.  Pneumococcal polysaccharide (PPSV23) vaccine.** / 1 to 2 doses if you smoke cigarettes or if you have certain conditions.  Meningococcal vaccine.** /  1 dose if you are age 19 to 21 years and a first-year college  student living in a residence hall, or have one of several medical conditions, you need to get vaccinated against meningococcal disease. You may also need additional booster doses.  Hepatitis A vaccine.** / Consult your health care provider.  Hepatitis B vaccine.** / Consult your health care provider.  Haemophilus influenzae type b (Hib) vaccine.** / Consult your health care provider. Ages 40 to 64 years  Blood pressure check.** / Every 1 to 2 years.  Lipid and cholesterol check.** / Every 5 years beginning at age 20 years.  Lung cancer screening. / Every year if you are aged 55-80 years and have a 30-pack-year history of smoking and currently smoke or have quit within the past 15 years. Yearly screening is stopped once you have quit smoking for at least 15 years or develop a health problem that would prevent you from having lung cancer treatment.  Clinical breast exam.** / Every year after age 40 years.  BRCA-related cancer risk assessment.** / For women who have family members with a BRCA-related cancer (breast, ovarian, tubal, or peritoneal cancers).  Mammogram.** / Every year beginning at age 40 years and continuing for as long as you are in good health. Consult with your health care provider.  Pap test.** / Every 3 years starting at age 30 years through age 65 or 70 years with a history of 3 consecutive normal Pap tests.  HPV screening.** / Every 3 years from ages 30 years through ages 65 to 70 years with a history of 3 consecutive normal Pap tests.  Fecal occult blood test (FOBT) of stool. / Every year beginning at age 50 years and continuing until age 75 years. You may not need to do this test if you get a colonoscopy every 10 years.  Flexible sigmoidoscopy or colonoscopy.** / Every 5 years for a flexible sigmoidoscopy or every 10 years for a colonoscopy beginning at age 50 years and continuing until age 75 years.  Hepatitis C blood test.** / For all people born from 1945 through  1965 and any individual with known risks for hepatitis C.  Skin self-exam. / Monthly.  Influenza vaccine. / Every year.  Tetanus, diphtheria, and acellular pertussis (Tdap/Td) vaccine.** / Consult your health care provider. Pregnant women should receive 1 dose of Tdap vaccine during each pregnancy. 1 dose of Td every 10 years.  Varicella vaccine.** / Consult your health care provider. Pregnant females who do not have evidence of immunity should receive the first dose after pregnancy.  Zoster vaccine.** / 1 dose for adults aged 60 years or older.  Measles, mumps, rubella (MMR) vaccine.** / You need at least 1 dose of MMR if you were born in 1957 or later. You may also need a 2nd dose. For females of childbearing age, rubella immunity should be determined. If there is no evidence of immunity, females who are not pregnant should be vaccinated. If there is no evidence of immunity, females who are pregnant should delay immunization until after pregnancy.  Pneumococcal 13-valent conjugate (PCV13) vaccine.** / Consult your health care provider.  Pneumococcal polysaccharide (PPSV23) vaccine.** / 1 to 2 doses if you smoke cigarettes or if you have certain conditions.  Meningococcal vaccine.** / Consult your health care provider.  Hepatitis A vaccine.** / Consult your health care provider.  Hepatitis B vaccine.** / Consult your health care provider.  Haemophilus influenzae type b (Hib) vaccine.** / Consult your health care provider. Ages 65   years and over  Blood pressure check.** / Every 1 to 2 years.  Lipid and cholesterol check.** / Every 5 years beginning at age 22 years.  Lung cancer screening. / Every year if you are aged 73-80 years and have a 30-pack-year history of smoking and currently smoke or have quit within the past 15 years. Yearly screening is stopped once you have quit smoking for at least 15 years or develop a health problem that would prevent you from having lung cancer  treatment.  Clinical breast exam.** / Every year after age 4 years.  BRCA-related cancer risk assessment.** / For women who have family members with a BRCA-related cancer (breast, ovarian, tubal, or peritoneal cancers).  Mammogram.** / Every year beginning at age 40 years and continuing for as long as you are in good health. Consult with your health care provider.  Pap test.** / Every 3 years starting at age 9 years through age 34 or 91 years with 3 consecutive normal Pap tests. Testing can be stopped between 65 and 70 years with 3 consecutive normal Pap tests and no abnormal Pap or HPV tests in the past 10 years.  HPV screening.** / Every 3 years from ages 57 years through ages 64 or 45 years with a history of 3 consecutive normal Pap tests. Testing can be stopped between 65 and 70 years with 3 consecutive normal Pap tests and no abnormal Pap or HPV tests in the past 10 years.  Fecal occult blood test (FOBT) of stool. / Every year beginning at age 15 years and continuing until age 17 years. You may not need to do this test if you get a colonoscopy every 10 years.  Flexible sigmoidoscopy or colonoscopy.** / Every 5 years for a flexible sigmoidoscopy or every 10 years for a colonoscopy beginning at age 86 years and continuing until age 71 years.  Hepatitis C blood test.** / For all people born from [birth year] through 1963 and any individual with known risks for hepatitis C.  Osteoporosis screening.** / A one-time screening for women ages 83 years and over and women at risk for fractures or osteoporosis.  Skin self-exam. / Monthly.  Influenza vaccine. / Every year.  Tetanus, diphtheria, and acellular pertussis (Tdap/Td) vaccine.** / 1 dose of Td every 10 years.  Varicella vaccine.** / Consult your health care provider.  Zoster vaccine.** / 1 dose for adults aged 61 years or older.  Pneumococcal 13-valent conjugate (PCV13) vaccine.** / Consult your health care provider.  Pneumococcal  polysaccharide (PPSV23) vaccine.** / 1 dose for all adults aged 28 years and older.  Meningococcal vaccine.** / Consult your health care provider.  Hepatitis A vaccine.** / Consult your health care provider.  Hepatitis B vaccine.** / Consult your health care provider.  Haemophilus influenzae type b (Hib) vaccine.** / Consult your health care provider. ** Family history and personal history of risk and conditions may change your health care provider's recommendations. Document Released: 01/31/2002 Document Revised: 04/21/2014 Document Reviewed: 05/02/2011 [facility] Patient Information 2015 [place], [place]. This information is not intended to replace advice given to you by your health care provider. Make sure you discuss any questions you have with your health care provider.

## 2015-03-17 NOTE — Progress Notes (Signed)
   Subjective:    Patient ID: Erin Kim, female    DOB: [birth year], 64 y.o.   MRN: [id number]  HPI Comments: She comes in for a physical but also has concerns. She wants to have a dermatologist check out some stable moles, she wants to have skin mapping done. She tells me that she has a hx of osteopenia and is due for a repeat DEXA scan.     Review of Systems  Constitutional: Negative.  Negative for fever, chills, diaphoresis, appetite change and fatigue.  HENT: Negative.   Eyes: Negative.   Respiratory: Negative.  Negative for cough, choking, chest tightness, shortness of breath and stridor.   Cardiovascular: Negative.  Negative for chest pain, palpitations and leg swelling.  Gastrointestinal: Negative.  Negative for vomiting, abdominal pain, diarrhea, constipation and blood in stool.  Endocrine: Negative.   Genitourinary: Negative.   Musculoskeletal: Negative.  Negative for back pain and arthralgias.  Skin: Negative.  Negative for rash.  Allergic/Immunologic: Negative.   Neurological: Negative.   Hematological: Negative.  Negative for adenopathy. Does not bruise/bleed easily.  Psychiatric/Behavioral: Negative.        Objective:   Physical Exam  Constitutional: She is oriented to person, place, and time. She appears well-developed and well-nourished. No distress.  HENT:  Head: Normocephalic and atraumatic.  Mouth/Throat: Oropharynx is clear and moist. No oropharyngeal exudate.  Eyes: Conjunctivae are normal. Right eye exhibits no discharge. Left eye exhibits no discharge. No scleral icterus.  Neck: Normal range of motion. Neck supple. No JVD present. No tracheal deviation present. No thyromegaly present.  Cardiovascular: Normal rate, regular rhythm, normal heart sounds and intact distal pulses.  Exam reveals no gallop and no friction rub.   No murmur heard. Pulmonary/Chest: Effort normal and breath sounds normal. No stridor. No respiratory distress. She has no wheezes.  She has no rales. She exhibits no tenderness.  Abdominal: Soft. Bowel sounds are normal. She exhibits no distension and no mass. There is no tenderness. There is no rebound and no guarding.  Musculoskeletal: Normal range of motion. She exhibits no edema or tenderness.  Lymphadenopathy:    She has no cervical adenopathy.  Neurological: She is oriented to person, place, and time.  Skin: Skin is dry. No rash noted. She is not diaphoretic. No erythema. No pallor.  Benign nevi noted on her right lower flank  Psychiatric: She has a normal mood and affect. Her behavior is normal. Judgment and thought content normal.  Vitals reviewed.    Lab Results  Component Value Date   WBC 6.7 03/16/2015   HGB 13.5 03/16/2015   HCT 40.4 03/16/2015   PLT 217.0 03/16/2015   GLUCOSE 97 03/16/2015   CHOL 185 03/16/2015   TRIG 68.0 03/16/2015   HDL 82.90 03/16/2015   LDLDIRECT 95.6 02/27/2012   LDLCALC 89 03/16/2015   ALT 20 03/16/2015   AST 20 03/16/2015   NA 136 03/16/2015   K 4.1 03/16/2015   CL 102 03/16/2015   CREATININE 0.70 03/16/2015   BUN 10 03/16/2015   CO2 32 03/16/2015   TSH 1.59 03/16/2015       Assessment & Plan:

## 2015-03-17 NOTE — Assessment & Plan Note (Signed)
Vaccines were reviewed and updated Exam done Labs ordered She saw her GYN one week ago She was referred for a mammogram Pt ed material was given

## 2015-03-17 NOTE — Assessment & Plan Note (Signed)
Will recheck her DEXA scan

## 2015-03-17 NOTE — Assessment & Plan Note (Signed)
Derm referral.

## 2015-04-09 ENCOUNTER — Encounter: Payer: Self-pay | Admitting: Internal Medicine

## 2015-06-02 ENCOUNTER — Telehealth: Payer: Self-pay

## 2015-06-02 NOTE — Telephone Encounter (Signed)
Called pt and reminded her that she is due for mammogram. She plans on getting it done in July when she is not having such a crazy work schedule.

## 2016-01-20 DIAGNOSIS — C44722 Squamous cell carcinoma of skin of right lower limb, including hip: Secondary | ICD-10-CM

## 2016-01-20 HISTORY — DX: Squamous cell carcinoma of skin of right lower limb, including hip: C44.722

## 2016-03-14 ENCOUNTER — Ambulatory Visit: Payer: No Typology Code available for payment source | Admitting: Nurse Practitioner

## 2016-03-21 ENCOUNTER — Ambulatory Visit (INDEPENDENT_AMBULATORY_CARE_PROVIDER_SITE_OTHER): Payer: No Typology Code available for payment source | Admitting: Nurse Practitioner

## 2016-03-21 ENCOUNTER — Encounter: Payer: Self-pay | Admitting: Nurse Practitioner

## 2016-03-21 VITALS — BP 118/74 | HR 52 | Ht 62.5 in | Wt 117.0 lb

## 2016-03-21 DIAGNOSIS — Z0000 Encounter for general adult medical examination without abnormal findings: Secondary | ICD-10-CM | POA: Diagnosis not present

## 2016-03-21 DIAGNOSIS — Z01419 Encounter for gynecological examination (general) (routine) without abnormal findings: Secondary | ICD-10-CM | POA: Diagnosis not present

## 2016-03-21 DIAGNOSIS — N760 Acute vaginitis: Secondary | ICD-10-CM | POA: Diagnosis not present

## 2016-03-21 NOTE — Patient Instructions (Addendum)

## 2016-03-21 NOTE — Progress Notes (Signed)
Patient ID: Erin Kim, female   DOB: 1951/03/10, 65 y.o.   MRN: [id number]  65 y.o. G3P2013 Married  Caucasian Fe here for annual exam.  S/P left ;lower leg squamous cancer removed 02/10/16.  Occasionally uses Premarin vaginal cream for atrophy.  She has vulvar eczema but feels more itchy than usual.  She treats this with olive oil or coconut cream when she flares.  Her husband is retired and she continues to work 3 jobs.  Patient's last menstrual period was 12/19/2001 (approximate).          Sexually active: Yes.    The current method of family planning is post menopausal status.    Exercising: Yes.    walking and zumba Smoker:  no  Health Maintenance: Pap: 02/25/14, negative with neg HR HPV MMG: 04/16/12, Bi-Rads 1: Negative Colonoscopy: 03/28/12, normal, repeat in 5 years due to family history of colon cancer (father) [id number] TDaP: 02/27/12 Shingles: Never Pneumonia: Not indicated due to age 89 C and HIV: done today Labs: PCP will schedule for 2017   reports that she quit smoking about 42 years ago. She has never used smokeless tobacco. She reports that she drinks about 3.6 oz of alcohol per week. She reports that she does not use illicit drugs.  Past Medical History  Diagnosis Date  . Needle phobia   . Squamous cell carcinoma of skin of right lower extremity 02/2015    Past Surgical History  Procedure Laterality Date  . Cesarean section  1989    Current Outpatient Prescriptions  Medication Sig Dispense Refill  . Flaxseed, Linseed, (FLAX SEED OIL PO) Take 1 tablespoon in juice daily.     No current facility-administered medications for this visit.    Family History  Problem Relation Age of Onset  . Diabetes Mother   . Hypertension Father   . Colon cancer Father   . Stroke Father   . Alcohol abuse Neg Hx   . Heart disease Neg Hx   . Hyperlipidemia Neg Hx   . Kidney disease Neg Hx   . Early death Neg Hx   . Cancer Maternal Grandmother   . Stroke Paternal  Grandmother     ROS:  Pertinent items are noted in HPI.  Otherwise, a comprehensive ROS was negative.  Exam:   BP 118/74 mmHg  Pulse 52  Ht 5' 2.5" (1.588 m)  Wt 117 lb (53.071 kg)  BMI 21.05 kg/m2  LMP 12/19/2001 (Approximate) Height: 5' 2.5" (158.8 cm) Ht Readings from Last 3 Encounters:  03/21/16 5' 2.5" (1.588 m)  03/16/15 5\' 3"  (1.6 m)  03/09/15 5\' 3"  (1.6 m)    General appearance: alert, cooperative and appears stated age Head: Normocephalic, without obvious abnormality, atraumatic Neck: no adenopathy, supple, symmetrical, trachea midline and thyroid normal to inspection and palpation Lungs: clear to auscultation bilaterally Breasts: normal appearance, no masses or tenderness Heart: regular rate and rhythm Abdomen: soft, non-tender; no masses,  no organomegaly Extremities: extremities normal, atraumatic, no cyanosis or edema Skin: Skin color, texture, turgor normal. No rashes or lesions Lymph nodes: Cervical, supraclavicular, and axillary nodes normal. No abnormal inguinal nodes palpated Neurologic: Grossly normal   Pelvic: External genitalia:  no lesions, but she does have a generalized redness that extends to the peri rectal area.  Affirm is done to make sure not yeast related.                 Urethra:  normal appearing urethra with no masses, tenderness or lesions  Bartholin's and Skene's: normal                 Vagina: normal appearing vagina with normal color and discharge, no lesions              Cervix: anteverted              Pap taken: No. Bimanual Exam:  Uterus:  normal size, contour, position, consistency, mobility, non-tender              Adnexa: no mass, fullness, tenderness               Rectovaginal: Confirms               Anus:  normal sphincter tone, no lesions  Chaperone present: yes  A:  Well Woman with normal exam  Postmenopausal no HRT Psoriaticexternal vaginitis vs. yeast Atrophic vaginitis - currently  rare use of vaginal estrogen   P:   Reviewed health and wellness pertinent to exam  Pap smear as above  Mammogram is due now and she is given phone numbers to call, PCP has already put in order for Mammo and BMD  Will follow with labs  Did not give her a refill on Premarin vaginal cream and she is aware that Mammo has to be done before a refill.  Counseled on breast self exam, mammography screening, adequate intake of calcium and vitamin D, diet and exercise return annually or prn  An After Visit Summary was printed and given to the patient.

## 2016-03-22 LAB — HIV ANTIBODY (ROUTINE TESTING W REFLEX): HIV 1&2 Ab, 4th Generation: NONREACTIVE

## 2016-03-22 LAB — WET PREP BY MOLECULAR PROBE
Candida species: NEGATIVE
GARDNERELLA VAGINALIS: NEGATIVE
Trichomonas vaginosis: NEGATIVE

## 2016-03-22 LAB — HEPATITIS C ANTIBODY: HCV AB: NEGATIVE

## 2016-03-24 ENCOUNTER — Telehealth: Payer: Self-pay | Admitting: *Deleted

## 2016-03-24 NOTE — Telephone Encounter (Signed)
Patient returned your call during lunch [phone].

## 2016-03-24 NOTE — Telephone Encounter (Signed)
I have attempted to contact this patient by phone with the following results: left message to return call to [place] at [phone] on answering machine (mobile/work). Name verified in [place].  Advised call was regarding recent labs.  [phone] (Mobile)

## 2016-03-24 NOTE — Telephone Encounter (Signed)
Patient is returning a call to Susan Moore. Patient is aware Colletta [place] has gone for the day and is asking for someone to call her asap with her results.

## 2016-03-24 NOTE — Telephone Encounter (Signed)
-----   Message from Kem Boroughs, [place] sent at 03/22/2016  8:04 AM EDT ----- Please let pt know that Hep C and HIV were negative as suspected.  The Wet prep is negative for infection.  Have her to continue her usual treatment for eczema of the vulva.

## 2016-03-24 NOTE — Telephone Encounter (Signed)
Spoke with patient. Advised of message and results as seen below from Kem Boroughs, [place]. She is agreeable and verbalizes understanding. She requests that all normal results be left on her voicemail in the future as she was worried when there were no details given. She has signed a DPR with this request as well.  Cc: Abelino Derrick  Routing to provider for final review. Patient agreeable to disposition. Will close encounter.

## 2016-03-27 NOTE — Progress Notes (Signed)
Reviewed personally.  M. Suzanne Jadelyn Elks, MD.  

## 2016-03-31 ENCOUNTER — Telehealth: Payer: Self-pay

## 2016-03-31 NOTE — Telephone Encounter (Signed)
Spoke with patient. Advised of message as seen below with recommendations. Patient declines further follow up or evaluation at this time. Aware if symptoms persist or she notices any changes with her vulvar eczema Dr.Miller would be happy to see her for further evaluation. She is agreeable. Phone call passed to her husband who has concerns regarding billing. Advised I will send a message to Janett Billow for a return call to discuss. He is agreeable.  Cc: Dr.Miller Ivar Drape  Routing to provider for final review. Patient agreeable to disposition. Will close encounter.

## 2016-03-31 NOTE — Telephone Encounter (Signed)
Kem Boroughs, FNP  Jasmine Awe, RN           Please advise pt that Dr. Sabra Heck has reviewed notes about her AEX and wanted to offer a vulvar biopsy. Would she consider?       Previous Messages     ----- Message -----   From: Megan Salon, MD   Sent: 03/27/2016  6:47 AM    To: Kem Boroughs, FNP  Subject: recent appt                    Patty,  Did this pt ever have a vulvar biopsy? Has a diagnosis of vulvar eczema. Might be good to consider if not really improved at follow-up.    Vinnie Level       Left message to call Verline Lema at [phone].

## 2017-01-26 ENCOUNTER — Encounter: Payer: Self-pay | Admitting: Gastroenterology

## 2017-03-27 ENCOUNTER — Ambulatory Visit (INDEPENDENT_AMBULATORY_CARE_PROVIDER_SITE_OTHER): Payer: PRIVATE HEALTH INSURANCE | Admitting: Nurse Practitioner

## 2017-03-27 ENCOUNTER — Other Ambulatory Visit: Payer: Self-pay | Admitting: Nurse Practitioner

## 2017-03-27 ENCOUNTER — Encounter: Payer: Self-pay | Admitting: Nurse Practitioner

## 2017-03-27 VITALS — BP 122/70 | HR 80 | Ht 62.25 in | Wt 114.0 lb

## 2017-03-27 DIAGNOSIS — Z01411 Encounter for gynecological examination (general) (routine) with abnormal findings: Secondary | ICD-10-CM

## 2017-03-27 DIAGNOSIS — Z0000 Encounter for general adult medical examination without abnormal findings: Secondary | ICD-10-CM | POA: Diagnosis not present

## 2017-03-27 DIAGNOSIS — Z1231 Encounter for screening mammogram for malignant neoplasm of breast: Secondary | ICD-10-CM

## 2017-03-27 DIAGNOSIS — L409 Psoriasis, unspecified: Secondary | ICD-10-CM

## 2017-03-27 DIAGNOSIS — L408 Other psoriasis: Secondary | ICD-10-CM | POA: Diagnosis not present

## 2017-03-27 DIAGNOSIS — Z800 Family history of malignant neoplasm of digestive organs: Secondary | ICD-10-CM

## 2017-03-27 NOTE — Progress Notes (Signed)
Scheduled patient while in office for bilateral screening mammogram at the [facility] on 04/17/2017 at 12:20 pm. Patient is agreeable to date and time.

## 2017-03-27 NOTE — Progress Notes (Signed)
66 y.o. G16P2013 Married  Caucasian Fe here for annual exam.  No vaso symptoms.   Some vaginal dryness and using OTC lubrications.  She has been to see a new dermatologist for multiple moles and skin changes.  Still wears a wig even though her hair is starting to grow back some.  Patient's last menstrual period was 12/19/2001 (approximate).          Sexually active: Yes.    The current method of family planning is post menopausal status.    Exercising: Yes.    zumba and walking Smoker:  no  Health Maintenance: Pap: 02/25/14, negative with neg HR HPV  12/19/10, Negative (per abstract) MMG: 03/28/12, Bi-Rads 1: Negative Colonoscopy: 03/28/12, normal, repeat in 5 years due to family history of colon cancer (father) declines rescheduling at this time - states will wait for 5 more yrs, [id number] TDaP: 02/27/12 Shingles: Never -will check at pharmacy Pneumonia: Not indicated due to age 21 C and HIV: 03/21/16 Labs: PCP takes care of all labs   reports that she quit smoking about 43 years ago. She has a 1.50 pack-year smoking history. She has never used smokeless tobacco. She reports that she drinks about 3.0 oz of alcohol per week . She reports that she does not use drugs.  Past Medical History:  Diagnosis Date  . Needle phobia   . Squamous cell carcinoma of skin of right lower extremity 01/2016    Past Surgical History:  Procedure Laterality Date  . CESAREAN SECTION  1989    Current Outpatient Prescriptions  Medication Sig Dispense Refill  . Calcium-Magnesium (CAL-MAG) 500-250 MG TABS Take 1 tablet by mouth at bedtime.    . Flaxseed, Linseed, (FLAX SEED OIL PO) Take 1 tablespoon in juice daily.     No current facility-administered medications for this visit.     Family History  Problem Relation Age of Onset  . Diabetes Mother   . Hypertension Father   . Colon cancer Father   . Stroke Father   . Thyroid disease Sister   . Cancer Maternal Grandmother   . Stroke Paternal  Grandmother   . Alcohol abuse Neg Hx   . Heart disease Neg Hx   . Hyperlipidemia Neg Hx   . Kidney disease Neg Hx   . Early death Neg Hx     ROS:  Pertinent items are noted in HPI.  Otherwise, a comprehensive ROS was negative.  Exam:   BP 122/70 (BP Location: Right Arm, Patient Position: Sitting, Cuff Size: Normal)   Pulse 80   Ht 5' 2.25" (1.581 m)   Wt 114 lb (51.7 kg)   LMP 12/19/2001 (Approximate)   BMI 20.68 kg/m  Height: 5' 2.25" (158.1 cm) Ht Readings from Last 3 Encounters:  03/27/17 5' 2.25" (1.581 m)  03/21/16 5' 2.5" (1.588 m)  03/16/15 5\' 3"  (1.6 m)    General appearance: alert, cooperative and appears stated age Head: Normocephalic, without obvious abnormality, atraumatic Neck: no adenopathy, supple, symmetrical, trachea midline and thyroid normal to inspection and palpation Lungs: clear to auscultation bilaterally Breasts: normal appearance, no masses or tenderness Heart: regular rate and rhythm Abdomen: soft, non-tender; no masses,  no organomegaly Extremities: extremities normal, atraumatic, no cyanosis or edema Skin: Skin color, texture, turgor normal. No rashes or lesions.  As noted psoriasis on the vulva, multiple moles and nevi Lymph nodes: Cervical, supraclavicular, and axillary nodes normal. No abnormal inguinal nodes palpated Neurologic: Grossly normal   Pelvic: External genitalia:  Lesions c/w  psoriasis              Urethra:  normal appearing urethra with no masses, tenderness or lesions              Bartholin's and Skene's: normal                 Vagina: normal appearing vagina with normal color and discharge, no lesions              Cervix: anteverted              Pap taken: Yes.   Bimanual Exam:  Uterus:  normal size, contour, position, consistency, mobility, non-tender              Adnexa: no mass, fullness, tenderness               Rectovaginal: Confirms               Anus:  normal sphincter tone, no lesions      Chaperone present:  yes  A:  Well Woman with normal exam  Postmenopausal no HRT Psoriaticexternal vaginitis - uses OTC lubrication prn - sees dermatologist Atrophic vaginitis - not on vaginal estrogen for yrs  P:   Reviewed health and wellness pertinent to exam  Pap smear was done  Mammogram is past due and will schedule today  Declines colonoscopy referral  Counseled on breast self exam, mammography screening, adequate intake of calcium and vitamin D, diet and exercise, Kegel's exercises return annually or prn  An After Visit Summary was printed and given to the patient.

## 2017-03-27 NOTE — Patient Instructions (Signed)

## 2017-03-28 LAB — HM PAP SMEAR

## 2017-03-28 NOTE — Progress Notes (Signed)
Encounter reviewed by Dr. Aundria Rud. Will review if patient has had a vulvar biopsy.

## 2017-03-29 LAB — IPS PAP TEST WITH HPV

## 2017-04-07 ENCOUNTER — Telehealth: Payer: Self-pay | Admitting: *Deleted

## 2017-04-07 DIAGNOSIS — L409 Psoriasis, unspecified: Secondary | ICD-10-CM

## 2017-04-07 NOTE — Telephone Encounter (Signed)
-----   Message from Kem Boroughs, [place] sent at 03/29/2017  5:30 PM EDT ----- Regarding: FW: Has patient had a vulvar biopsy? Can you call pt and get ROI for biopsy showing psoriasis of the vulva. ----- Message ----- From: Nunzio Cobbs, MD Sent: 03/29/2017   9:24 AM To: Kem Boroughs, FNP Subject: RE: Has patient had a vulvar biopsy?           Let's get a copy of the biopsy from her dermatologist so we can scan it in.   Thanks,   [place]  ----- Message ----- From: Kem Boroughs, FNP Sent: 03/29/2017   8:04 AM To: Brook Oletta Lamas, MD Subject: RE: Has patient had a vulvar biopsy?           Per pt the biopsy was done at dermatologist yrs ago when she was first diagnosed. ----- Message ----- From: Nunzio Cobbs, MD Sent: 03/28/2017   5:02 PM To: Kem Boroughs, FNP Subject: Has patient had a vulvar biopsy?               Hi Patty,   Has your patient done a vulvar biopsy?  Thanks,   [employer]

## 2017-04-17 ENCOUNTER — Telehealth: Payer: Self-pay | Admitting: Nurse Practitioner

## 2017-04-17 ENCOUNTER — Ambulatory Visit: Payer: PRIVATE HEALTH INSURANCE

## 2017-04-17 NOTE — Telephone Encounter (Signed)
Spoke with spouse, asking for recommendations for breast screening locations that will be covered by insurance plan. Advised to contact plan directly for coverage and locations. Spouse reports [facility] not covered under plan, provided [facility] contact information. Will return call.

## 2017-04-17 NOTE — Telephone Encounter (Signed)
Patient was referred to the breast center for a mammogram and they are not in network with her insurance [employer].

## 2017-04-17 NOTE — Telephone Encounter (Signed)
Left message to call Erin Kim at [phone].  

## 2017-04-17 NOTE — Telephone Encounter (Signed)
Spoke with spouse, was advised [facility] covered under plan for screening MMG, will call directly to schedule. Spouse thankful for help.  Routing to provider for final review. Patient is agreeable to disposition. Will close encounter.

## 2017-04-21 NOTE — Telephone Encounter (Signed)
I have attempted to contact this patient by phone with the following results: left message to return call to [place] at [phone] on answering machine (mobile per [facility]).  Name was verified on voicemail, but no personal information given. [phone] (Mobile)

## 2017-04-21 NOTE — Telephone Encounter (Signed)
Spoke with patient on 04/14/17 regarding biopsy for psoriasis on vulva.  Pt states any biopsy would have been done in our office and Patty did it last year at her annual exam. Chart review shows AFFIRM done at last annual but no biopsy. Patient states she has not seen a dermatologist for this. Advised patient that only test done at last annual exam was looking for yeast, BV or Trichomonas only and would not show any other abnormalities. Patient voices understanding that no biopsy taken at annual exam in 2017.  Please advise follow up.

## 2017-04-21 NOTE — Telephone Encounter (Signed)
Please have pt to return to see Dr. Quincy Simmonds and have a vulvar biopsy to confirm diagnosis since her areas of involvement are so extensive.

## 2017-04-24 NOTE — Telephone Encounter (Signed)
Please contact patient in follow up to recommended vulvar biopsy.  We are unable to find any documented vulvar biopsy in her chart.  [facility] does not perform this service. I made this recommendation due to extensive vulvar changes on her examination this year.  On chart review, Dr. Sabra Heck apparently made this recommendation last year as well.   If the patient would like to come in for me to assess the area first, I welcome her to come in for a visit.   We can send her a letter if we are unable to reach her through the phone.   [place]

## 2017-04-25 NOTE — Telephone Encounter (Signed)
Call to patient. Voice mail confirms first and last name. Left message to call back and ask for [facility].

## 2017-04-26 NOTE — Telephone Encounter (Signed)
Patient is returning a call to Sally. °

## 2017-04-26 NOTE — Telephone Encounter (Signed)
Return call to patient. Voice mail has name confirmation. Left brief but detailed message regarding recommendations. Requested call back for further discussion of recommendations.

## 2017-04-26 NOTE — Telephone Encounter (Signed)
Patient returning call. Ok to leave a detailed voicemail message.

## 2017-04-27 NOTE — Telephone Encounter (Signed)
Call from patient. Reviewed recommendations from Dr Quincy Simmonds and Dr Sabra Heck. Brief discussion on increase in skin changes from previous year. Confirmed that biopsy is to rule out any cancerous changes but could condition could also be related to benign skin conditions. Important to have biopsy so we can determine what the condition is and if treatment is needed. Brief review of procedure provided. Assured that numbing medication is used for procedure.  Appointment scheduled for 05-08-17 with Dr Sabra Heck. Scheduled for first available date per patient scheduling requests.  Questions answered from nursing perspective. Advised Dr Sabra Heck will review in detail prior to procedure.  Routing to provider for final review. Patient agreeable to disposition. Will close encounter.    CC:Dr [facility] Edman Circle, FNP

## 2017-05-01 ENCOUNTER — Telehealth: Payer: Self-pay | Admitting: Obstetrics & Gynecology

## 2017-05-01 ENCOUNTER — Telehealth: Payer: Self-pay | Admitting: Nurse Practitioner

## 2017-05-01 NOTE — Telephone Encounter (Signed)
Call to patient to review benefit for upcoming biopsy. Left voicemail with information to return call and speak with [place] or [place].

## 2017-05-01 NOTE — Telephone Encounter (Signed)
Patient is confused about an appointment that is scheduled for her and would like to speak with a nurse about it.

## 2017-05-01 NOTE — Telephone Encounter (Addendum)
Spoke with patient. Patient states she is scheduled for vulvar biopsy on 05/08/17 with Dr. Sabra Heck. Patient states she had some lab work and a biopsy last year, would like to make sure that this was not the same. Reviewed 2017 labs with patient, no vulvar biopsies noted per review of EPIC. Patient would like to review benefits of upcoming procedure. Placed patient on brief hold, reviewed with Jacqlyn Larsen, will need to return call to patient to review benefits. Spoke with patient, advised Jacqlyn Larsen would return call to review benefits, patient thankful for clarification and verbalizes understanding.   Routing to provider for final review. Patient is agreeable to disposition. Will close encounter.   Cc: Kem Boroughs, NP, Theresia Lo

## 2017-05-03 NOTE — Telephone Encounter (Signed)
Patient husband returned call for his wife.

## 2017-05-04 NOTE — Telephone Encounter (Signed)
Forwarding this patient to [place].  Pt cancelled appointment today.  Biopsy has been recommended for the last two years.

## 2017-05-04 NOTE — Telephone Encounter (Signed)
Patient canceled appointment for a procedure. She states she will call back to reschedule at a later time.

## 2017-05-08 ENCOUNTER — Ambulatory Visit: Payer: PRIVATE HEALTH INSURANCE | Admitting: Obstetrics & Gynecology

## 2017-05-08 NOTE — Telephone Encounter (Signed)
Patient's husband returned call regarding benefits for recommended vulvar biopsy. Patient's spouse, Rolonda Pontarelli, is on 2017 DPR and verified patients name and date of birth. Patient audible in background of call and provided additional verification.  Benefits reviewed and all questions answered. See account notes.   Patients husband voiced multiple questions and concerns regarding procedure and recommendations. Previously scheduled Dermatology appointment this Thursday 05-11-2017. They plan to proceed with this and decline further evaluation here until Dermatology appointment complete.   Patient's spouse agreeable to return call from nurse or physician to review recommendations and answer questions.  Phone call 20+ minutes.   Routing to Ed[place] Circle, FNP for review.  Cc: Dr. Sabra Heck, managing physician and Lamont Snowball, RN, Nurse Supervisor

## 2017-05-24 NOTE — Telephone Encounter (Signed)
Can we get a copy of dermatology report to see what the diagnosis was and treatment.

## 2017-05-24 NOTE — Telephone Encounter (Signed)
Patient's spouse called in to office to update result of previous phone call.   He states his wife completed her recent Dermatology appointment and per her Dermatologist's recommendation, Mr. Haverstick declines, on his wife's behalf, to schedule her recommended biopsy.   Routing to Edman Circle, FNP for review  Cc: Dr. Sabra Heck, managing physician and Lamont Snowball, RN

## 2017-05-30 NOTE — Telephone Encounter (Signed)
Forwarding to Edman Circle to call pt to see if she will have records forwarded.

## 2017-05-31 ENCOUNTER — Telehealth: Payer: Self-pay | Admitting: Nurse Practitioner

## 2017-05-31 NOTE — Telephone Encounter (Signed)
I have called pt back about her [facility] apt. to see what the diagnosis was for the perineal rash.  Pt says she does not recall the name - but felt like it was a psoriasis form.  She was given a steroid cream and to use for 3 months and return.  She also had other skin lesions removed that the MD was concerned about and she is having further testing.  She was asked to have them send [place] a copy of reports and she will do that.  She was appreciative of call.

## 2017-06-06 LAB — CBC AND DIFFERENTIAL
HCT: 40 (ref 36–46)
Hemoglobin: 13 (ref 12.0–16.0)
Neutrophils Absolute: 5478
Platelets: 260 (ref 150–399)
WBC: 8.3

## 2017-06-06 LAB — HEPATIC FUNCTION PANEL
ALK PHOS: 54 (ref 25–125)
ALT: 14 (ref 7–35)
AST: 17 (ref 13–35)
Bilirubin, Total: 0.7

## 2017-06-13 ENCOUNTER — Encounter: Payer: Self-pay | Admitting: Internal Medicine

## 2017-07-07 NOTE — Telephone Encounter (Deleted)
Good afternoon,   I am sending a message a to request a copy of screening mammogram if this has been completed. I was not able to locate in chart.

## 2017-07-17 ENCOUNTER — Telehealth: Payer: Self-pay | Admitting: Obstetrics & Gynecology

## 2017-07-17 NOTE — Telephone Encounter (Signed)
Left message for patient to call to reschedule her Edman Circle appointment.

## 2018-01-23 ENCOUNTER — Encounter: Payer: PRIVATE HEALTH INSURANCE | Admitting: Internal Medicine

## 2018-04-02 ENCOUNTER — Ambulatory Visit: Payer: PRIVATE HEALTH INSURANCE | Admitting: Nurse Practitioner

## 2018-09-20 DIAGNOSIS — D3709 Neoplasm of uncertain behavior of other specified sites of the oral cavity: Secondary | ICD-10-CM | POA: Diagnosis not present

## 2018-09-26 DIAGNOSIS — K136 Irritative hyperplasia of oral mucosa: Secondary | ICD-10-CM | POA: Diagnosis not present

## 2018-11-05 DIAGNOSIS — R69 Illness, unspecified: Secondary | ICD-10-CM | POA: Diagnosis not present

## 2019-01-24 DIAGNOSIS — D4701 Cutaneous mastocytosis: Secondary | ICD-10-CM | POA: Diagnosis not present

## 2019-01-24 DIAGNOSIS — Z23 Encounter for immunization: Secondary | ICD-10-CM | POA: Diagnosis not present

## 2019-01-24 DIAGNOSIS — R238 Other skin changes: Secondary | ICD-10-CM | POA: Diagnosis not present

## 2019-02-26 ENCOUNTER — Other Ambulatory Visit (INDEPENDENT_AMBULATORY_CARE_PROVIDER_SITE_OTHER): Payer: Medicare HMO

## 2019-02-26 ENCOUNTER — Encounter: Payer: Self-pay | Admitting: Internal Medicine

## 2019-02-26 ENCOUNTER — Ambulatory Visit (INDEPENDENT_AMBULATORY_CARE_PROVIDER_SITE_OTHER): Payer: Medicare HMO | Admitting: Internal Medicine

## 2019-02-26 VITALS — BP 120/60 | HR 77 | Temp 97.7°F | Resp 16 | Ht 62.25 in | Wt 114.5 lb

## 2019-02-26 DIAGNOSIS — R5382 Chronic fatigue, unspecified: Secondary | ICD-10-CM | POA: Insufficient documentation

## 2019-02-26 DIAGNOSIS — M8589 Other specified disorders of bone density and structure, multiple sites: Secondary | ICD-10-CM | POA: Diagnosis not present

## 2019-02-26 DIAGNOSIS — Z0001 Encounter for general adult medical examination with abnormal findings: Secondary | ICD-10-CM

## 2019-02-26 DIAGNOSIS — E785 Hyperlipidemia, unspecified: Secondary | ICD-10-CM | POA: Diagnosis not present

## 2019-02-26 DIAGNOSIS — E559 Vitamin D deficiency, unspecified: Secondary | ICD-10-CM | POA: Diagnosis not present

## 2019-02-26 DIAGNOSIS — Z1239 Encounter for other screening for malignant neoplasm of breast: Secondary | ICD-10-CM | POA: Insufficient documentation

## 2019-02-26 DIAGNOSIS — Z23 Encounter for immunization: Secondary | ICD-10-CM

## 2019-02-26 DIAGNOSIS — D4701 Cutaneous mastocytosis: Secondary | ICD-10-CM | POA: Diagnosis not present

## 2019-02-26 DIAGNOSIS — E538 Deficiency of other specified B group vitamins: Secondary | ICD-10-CM

## 2019-02-26 DIAGNOSIS — Z0000 Encounter for general adult medical examination without abnormal findings: Secondary | ICD-10-CM

## 2019-02-26 DIAGNOSIS — K635 Polyp of colon: Secondary | ICD-10-CM | POA: Insufficient documentation

## 2019-02-26 LAB — LIPID PANEL
CHOLESTEROL: 205 mg/dL — AB (ref 0–200)
HDL: 96.1 mg/dL (ref >39.00)
LDL Cholesterol: 87 mg/dL (ref 0–99)
NonHDL: 108.73
Total CHOL/HDL Ratio: 2
Triglycerides: 108 mg/dL (ref 0.0–149.0)
VLDL: 21.6 mg/dL (ref 0.0–40.0)

## 2019-02-26 LAB — COMPREHENSIVE METABOLIC PANEL
ALBUMIN: 4.4 g/dL (ref 3.5–5.2)
ALT: 14 U/L (ref 0–35)
AST: 15 U/L (ref 0–37)
Alkaline Phosphatase: 60 U/L (ref 39–117)
BUN: 11 mg/dL (ref 6–23)
CO2: 29 mEq/L (ref 19–32)
Calcium: 9.1 mg/dL (ref 8.4–10.5)
Chloride: 100 mEq/L (ref 96–112)
Creatinine, Ser: 0.72 mg/dL (ref 0.40–1.20)
GFR: 81.19 mL/min (ref >60.00)
Glucose, Bld: 89 mg/dL (ref 70–99)
Potassium: 4 mEq/L (ref 3.5–5.1)
SODIUM: 137 meq/L (ref 135–145)
Total Bilirubin: 0.7 mg/dL (ref 0.2–1.2)
Total Protein: 7 g/dL (ref 6.0–8.3)

## 2019-02-26 LAB — CBC WITH DIFFERENTIAL/PLATELET
Basophils Absolute: 0.1 10*3/uL (ref 0.0–0.1)
Basophils Relative: 0.9 % (ref 0.0–3.0)
Eosinophils Absolute: 0.2 10*3/uL (ref 0.0–0.7)
Eosinophils Relative: 2.3 % (ref 0.0–5.0)
HCT: 41.4 % (ref 36.0–46.0)
Hemoglobin: 14.1 g/dL (ref 12.0–15.0)
Lymphocytes Relative: 19.7 % (ref 12.0–46.0)
Lymphs Abs: 1.5 10*3/uL (ref 0.7–4.0)
MCHC: 34 g/dL (ref 30.0–36.0)
MCV: 89.9 fl (ref 78.0–100.0)
MONO ABS: 0.5 10*3/uL (ref 0.1–1.0)
Monocytes Relative: 7.1 % (ref 3.0–12.0)
Neutro Abs: 5.2 10*3/uL (ref 1.4–7.7)
Neutrophils Relative %: 70 % (ref 43.0–77.0)
Platelets: 269 10*3/uL (ref 150.0–400.0)
RBC: 4.61 Mil/uL (ref 3.87–5.11)
RDW: 13.4 % (ref 11.5–15.5)
WBC: 7.4 10*3/uL (ref 4.0–10.5)

## 2019-02-26 LAB — TSH: TSH: 1.4 u[IU]/mL (ref 0.35–4.50)

## 2019-02-26 LAB — FOLATE: Folate: 19.5 ng/mL (ref >5.9)

## 2019-02-26 LAB — VITAMIN D 25 HYDROXY (VIT D DEFICIENCY, FRACTURES): VITD: 18.93 ng/mL — ABNORMAL LOW (ref 30.00–100.00)

## 2019-02-26 LAB — VITAMIN B12: VITAMIN B 12: 236 pg/mL (ref 211–911)

## 2019-02-26 MED ORDER — CYANOCOBALAMIN 2000 MCG PO TABS: 2000.0000 ug | ORAL_TABLET | Freq: Every day | ORAL | 1 refills | Status: DC

## 2019-02-26 MED ORDER — CHOLECALCIFEROL 1.25 MG (50000 UT) PO CAPS: 50000.0000 [IU] | ORAL_CAPSULE | ORAL | 0 refills | Status: DC

## 2019-02-26 NOTE — Patient Instructions (Signed)

## 2019-02-26 NOTE — Progress Notes (Signed)
Subjective:  Patient ID: Erin Kim, female    DOB: [birth year]  Age: 68 y.o. MRN: [id number]  CC: Annual Exam and Rash   HPI LABRITTANY WECHTER presents for a CPX.  Since I last saw her she has been diagnosed with urticaria pigmentosa.  She has a mildly pruritis rash on her torso.  She has been treated by a dermatologist.  She also complains of fatigue and wants to know if she has vitamin deficiencies.  Past Medical History:  Diagnosis Date  . Needle phobia   . Squamous cell carcinoma of skin of right lower extremity 01/2016   Past Surgical History:  Procedure Laterality Date  . [place]    reports that she quit smoking about 45 years ago. She has a 1.50 pack-year smoking history. She has never used smokeless tobacco. She reports current alcohol use of about 14.0 standard drinks of alcohol per week. She reports that she does not use drugs. family history includes Cancer in her maternal grandmother; Colon cancer in her father; Diabetes in her mother; Hypertension in her father; Stroke in her father and paternal grandmother; Thyroid disease in her sister. No Known Allergies  Outpatient Medications Prior to Visit  Medication Sig Dispense Refill  . Calcium-Magnesium (CAL-MAG) 500-250 MG TABS Take 1 tablet by mouth at bedtime.    . Flaxseed, Linseed, (FLAX SEED OIL PO) Take 1 tablespoon in juice daily.     No facility-administered medications prior to visit.     ROS Review of Systems  Constitutional: Positive for fatigue. Negative for appetite change, diaphoresis and unexpected weight change.  HENT: Negative.   Eyes: Negative.   Respiratory: Negative.  Negative for cough and shortness of breath.   Cardiovascular: Negative.  Negative for chest pain, palpitations and leg swelling.  Gastrointestinal: Negative for abdominal pain, constipation, diarrhea, nausea and vomiting.  Endocrine: Negative.   Genitourinary: Negative.  Negative for difficulty urinating.    Musculoskeletal: Negative.  Negative for arthralgias and back pain.  Skin: Positive for rash. Negative for color change and pallor.  Neurological: Negative.  Negative for dizziness, weakness and light-headedness.  Hematological: Negative for adenopathy. Does not bruise/bleed easily.  Psychiatric/Behavioral: Negative.     Objective:  BP 120/60 (BP Location: Left Arm, Patient Position: Sitting, Cuff Size: Normal)   Pulse 77   Temp 97.7 F (36.5 C) (Oral)   Resp 16   Ht 5' 2.25" (1.581 m)   Wt 114 lb 8 oz (51.9 kg)   LMP 12/19/2001 (Approximate)   SpO2 99%   BMI 20.77 kg/m   BP Readings from Last 3 Encounters:  02/26/19 120/60  03/27/17 122/70  03/21/16 118/74    Wt Readings from Last 3 Encounters:  02/26/19 114 lb 8 oz (51.9 kg)  03/27/17 114 lb (51.7 kg)  03/21/16 117 lb (53.1 kg)    Physical Exam Vitals signs reviewed.  Constitutional:      Appearance: She is not ill-appearing or diaphoretic.  HENT:     Nose: Nose normal. No congestion or rhinorrhea.     Mouth/Throat:     Mouth: Mucous membranes are moist.     Pharynx: Oropharynx is clear. No oropharyngeal exudate or posterior oropharyngeal erythema.  Eyes:     General: No scleral icterus.    Conjunctiva/sclera: Conjunctivae normal.  Neck:     Musculoskeletal: Normal range of motion and neck supple. No muscular tenderness.  Cardiovascular:     Rate and Rhythm: Normal rate and regular rhythm.  Heart sounds: No murmur. No gallop.   Pulmonary:     Effort: Pulmonary effort is normal.     Breath sounds: No stridor. No wheezing, rhonchi or rales.  Abdominal:     General: Bowel sounds are normal.     Palpations: There is no hepatomegaly, splenomegaly or mass.     Tenderness: There is no abdominal tenderness. There is no guarding.  Musculoskeletal: Normal range of motion.        General: No swelling.     Right lower leg: No edema.     Left lower leg: No edema.  Lymphadenopathy:     Cervical: No cervical  adenopathy.  Skin:    General: Skin is warm and dry.     Coloration: Skin is not jaundiced.     Findings: Rash present.     Comments: There are brownish macules scattered throughout the torso.  They each measure about 0.5 cm.  Neurological:     General: No focal deficit present.     Mental Status: She is oriented to person, place, and time. Mental status is at baseline.  Psychiatric:        Mood and Affect: Mood normal.        Behavior: Behavior normal.        Thought Content: Thought content normal.        Judgment: Judgment normal.     Lab Results  Component Value Date   WBC 7.4 02/26/2019   HGB 14.1 02/26/2019   HCT 41.4 02/26/2019   PLT 269.0 02/26/2019   GLUCOSE 89 02/26/2019   CHOL 205 (H) 02/26/2019   TRIG 108.0 02/26/2019   HDL 96.10 02/26/2019   LDLDIRECT 95.6 02/27/2012   LDLCALC 87 02/26/2019   ALT 14 02/26/2019   AST 15 02/26/2019   NA 137 02/26/2019   K 4.0 02/26/2019   CL 100 02/26/2019   CREATININE 0.72 02/26/2019   BUN 11 02/26/2019   CO2 29 02/26/2019   TSH 1.40 02/26/2019    Mm Digital Screening  Result Date: 04/18/2012 DG SCREEN MAMMOGRAM BILATERAL Bilateral CC and MLO view(s) were taken. DIGITAL SCREENING MAMMOGRAM WITH CAD: There are scattered fibroglandular densities.  There is no dominant mass, architectural distortion or calcification to suggest malignancy. Images were processed with CAD. IMPRESSION: No mammographic evidence of malignancy.  Suggest yearly screening mammography. A result letter of this screening mammogram will be mailed directly to the patient. ASSESSMENT: Negative - BI-RADS 1 Screening mammogram in 1 year. ,   Assessment & Plan:   Christie was seen today for annual exam and rash.  Diagnoses and all orders for this visit:  Routine general medical examination at a health care facility  Hyperlipidemia LDL goal <130- She has a low ASCVD risk score so I do not recommend a statin for CV risk reduction. -     Lipid panel;  Future -     Comprehensive metabolic panel; Future -     TSH; Future  Breast cancer screening -     MM Digital Screening; Future  Osteopenia of multiple sites -     VITAMIN D 25 Hydroxy (Vit-D Deficiency, Fractures); Future  Urticaria pigmentosa, adult form- Her lab work is negative for internal involvement. -     Comprehensive metabolic panel; Future -     CBC with Differential/Platelet; Future  Hyperplastic colonic polyp, unspecified part of colon -     Ambulatory referral to Gastroenterology  Chronic fatigue- Her labs are remarkable for a mild B12 deficiency  and vitamin D deficiency. -     Comprehensive metabolic panel; Future -     CBC with Differential/Platelet; Future -     TSH; Future -     Vitamin B12; Future -     Folate; Future  Need for pneumococcal vaccination -     Pneumococcal conjugate vaccine 13-valent  B12 deficiency -     cyanocobalamin 2000 MCG tablet; Take 1 tablet (2,000 mcg total) by mouth daily.  Vitamin D deficiency -     Cholecalciferol 1.25 MG (50000 UT) capsule; Take 1 capsule (50,000 Units total) by mouth once a week.   I am having Deliah Boston. Reckart start on cyanocobalamin and Cholecalciferol. I am also having her maintain her (Flaxseed, Linseed, (FLAX SEED OIL PO)) and Calcium-Magnesium.  Meds ordered this encounter  Medications  . cyanocobalamin 2000 MCG tablet    Sig: Take 1 tablet (2,000 mcg total) by mouth daily.    Dispense:  90 tablet    Refill:  1  . Cholecalciferol 1.25 MG (50000 UT) capsule    Sig: Take 1 capsule (50,000 Units total) by mouth once a week.    Dispense:  12 capsule    Refill:  0   See AVS for instructions about healthy living and anticipatory guidance.  Follow-up: Return in about 1 year (around 02/26/2020).  Scarlette Calico, MD

## 2019-02-27 NOTE — Assessment & Plan Note (Signed)

## 2019-03-05 ENCOUNTER — Telehealth: Payer: Self-pay | Admitting: Internal Medicine

## 2019-03-05 NOTE — Telephone Encounter (Signed)
Copied from [place] [id number]. Topic: Quick Communication - Rx Refill/Question >> Mar 05, 2019  9:20 AM Sheran Luz wrote: Medication: cyanocobalamin 2000 MCG tablet   Patient states this medication was sent to wrong pharmacy.   Preferred Pharmacy (with phone number or street name:[facility] [address], [place] [phone] (Phone) [phone] (Fax)

## 2019-03-05 NOTE — Telephone Encounter (Signed)
Informed patient

## 2019-03-05 NOTE — Telephone Encounter (Signed)
Can you contact patient have her call the pharmacy and ask that they transfer the prescription.

## 2019-03-08 ENCOUNTER — Encounter: Payer: Self-pay | Admitting: Gastroenterology

## 2019-04-17 ENCOUNTER — Encounter: Payer: Medicare HMO | Admitting: Gastroenterology

## 2019-04-23 ENCOUNTER — Telehealth: Payer: Self-pay | Admitting: *Deleted

## 2019-04-23 NOTE — Telephone Encounter (Signed)
Attempted pt again to [facility]- LM on voice mail  that identifies pt to call and RS when she is ready to do so Lelan Pons PV

## 2019-04-23 NOTE — Telephone Encounter (Signed)
Called pt to [facility]- LM to return my call  Lelan Pons PV

## 2019-05-06 ENCOUNTER — Encounter: Payer: Medicare HMO | Admitting: Gastroenterology

## 2019-05-06 ENCOUNTER — Encounter: Payer: Self-pay | Admitting: Gastroenterology

## 2019-05-30 ENCOUNTER — Ambulatory Visit (AMBULATORY_SURGERY_CENTER): Payer: Self-pay | Admitting: *Deleted

## 2019-05-30 ENCOUNTER — Other Ambulatory Visit: Payer: Self-pay

## 2019-05-30 VITALS — Ht 62.0 in | Wt 119.0 lb

## 2019-05-30 DIAGNOSIS — Z800 Family history of malignant neoplasm of digestive organs: Secondary | ICD-10-CM

## 2019-05-30 MED ORDER — PLENVU 140 G PO SOLR: 1.0000 | Freq: Once | ORAL | 0 refills | Status: AC

## 2019-05-30 NOTE — Progress Notes (Signed)
Patient's pre-visit was done today over the phone with the patient due to COVID-19 pandemic. Name,DOB and address verified. Insurance verified. Packet of Prep instructions to patient including copy of a consent form and pre-procedure patient acknowledgement form and plenvu sample on 3rd floorpt is aware. Patient understands to call us back with any questions or concerns.   Patient denies any allergies to eggs or soy. Patient denies any problems with anesthesia/sedation. Patient denies any oxygen use at home. Patient denies taking any diet/weight loss medications or blood thinners. EMMI education assisgned to patient on colonoscopy, this was explained and instructions given to patient.

## 2019-06-04 ENCOUNTER — Ambulatory Visit
Admission: RE | Admit: 2019-06-04 | Discharge: 2019-06-04 | Disposition: A | Discharge status: Home / Self Care | Payer: Medicare HMO | Source: Ambulatory Visit | Attending: Internal Medicine | Admitting: Internal Medicine

## 2019-06-04 ENCOUNTER — Other Ambulatory Visit: Payer: Self-pay

## 2019-06-04 DIAGNOSIS — Z1231 Encounter for screening mammogram for malignant neoplasm of breast: Secondary | ICD-10-CM | POA: Diagnosis not present

## 2019-06-04 DIAGNOSIS — Z1239 Encounter for other screening for malignant neoplasm of breast: Secondary | ICD-10-CM

## 2019-06-04 LAB — HM MAMMOGRAPHY

## 2019-06-14 ENCOUNTER — Encounter: Payer: Medicare HMO | Admitting: Gastroenterology

## 2019-06-19 ENCOUNTER — Encounter: Payer: Self-pay | Admitting: Gastroenterology

## 2019-07-08 DIAGNOSIS — R69 Illness, unspecified: Secondary | ICD-10-CM | POA: Diagnosis not present

## 2019-07-30 DIAGNOSIS — D4701 Cutaneous mastocytosis: Secondary | ICD-10-CM | POA: Diagnosis not present

## 2019-07-30 DIAGNOSIS — L821 Other seborrheic keratosis: Secondary | ICD-10-CM | POA: Diagnosis not present

## 2019-07-30 DIAGNOSIS — R238 Other skin changes: Secondary | ICD-10-CM | POA: Diagnosis not present

## 2019-08-16 DIAGNOSIS — D4701 Cutaneous mastocytosis: Secondary | ICD-10-CM | POA: Diagnosis not present

## 2019-08-22 DIAGNOSIS — R69 Illness, unspecified: Secondary | ICD-10-CM | POA: Diagnosis not present

## 2020-01-14 DIAGNOSIS — R69 Illness, unspecified: Secondary | ICD-10-CM | POA: Diagnosis not present

## 2020-04-07 ENCOUNTER — Other Ambulatory Visit: Payer: Self-pay

## 2020-04-07 ENCOUNTER — Encounter: Payer: Self-pay | Admitting: Internal Medicine

## 2020-04-07 ENCOUNTER — Ambulatory Visit (INDEPENDENT_AMBULATORY_CARE_PROVIDER_SITE_OTHER): Payer: Medicare HMO | Admitting: Internal Medicine

## 2020-04-07 VITALS — BP 132/80 | HR 81 | Temp 98.4°F | Ht 62.0 in | Wt 119.0 lb

## 2020-04-07 DIAGNOSIS — Z23 Encounter for immunization: Secondary | ICD-10-CM | POA: Diagnosis not present

## 2020-04-07 DIAGNOSIS — E785 Hyperlipidemia, unspecified: Secondary | ICD-10-CM

## 2020-04-07 DIAGNOSIS — Z0000 Encounter for general adult medical examination without abnormal findings: Secondary | ICD-10-CM

## 2020-04-07 DIAGNOSIS — E559 Vitamin D deficiency, unspecified: Secondary | ICD-10-CM | POA: Diagnosis not present

## 2020-04-07 DIAGNOSIS — E538 Deficiency of other specified B group vitamins: Secondary | ICD-10-CM

## 2020-04-07 LAB — HEPATIC FUNCTION PANEL
ALT: 16 U/L (ref 0–35)
AST: 17 U/L (ref 0–37)
Albumin: 4.3 g/dL (ref 3.5–5.2)
Alkaline Phosphatase: 58 U/L (ref 39–117)
Bilirubin, Direct: 0.1 mg/dL (ref 0.0–0.3)
Total Bilirubin: 0.6 mg/dL (ref 0.2–1.2)
Total Protein: 6.9 g/dL (ref 6.0–8.3)

## 2020-04-07 LAB — CBC WITH DIFFERENTIAL/PLATELET
Basophils Absolute: 0.1 10*3/uL (ref 0.0–0.1)
Basophils Relative: 0.9 % (ref 0.0–3.0)
Eosinophils Absolute: 0.2 10*3/uL (ref 0.0–0.7)
Eosinophils Relative: 2.5 % (ref 0.0–5.0)
HCT: 41.3 % (ref 36.0–46.0)
Hemoglobin: 13.7 g/dL (ref 12.0–15.0)
Lymphocytes Relative: 19.3 % (ref 12.0–46.0)
Lymphs Abs: 1.7 10*3/uL (ref 0.7–4.0)
MCHC: 33.1 g/dL (ref 30.0–36.0)
MCV: 91.9 fl (ref 78.0–100.0)
Monocytes Absolute: 0.7 10*3/uL (ref 0.1–1.0)
Monocytes Relative: 8.1 % (ref 3.0–12.0)
Neutro Abs: 6.1 10*3/uL (ref 1.4–7.7)
Neutrophils Relative %: 69.2 % (ref 43.0–77.0)
Platelets: 244 10*3/uL (ref 150.0–400.0)
RBC: 4.5 Mil/uL (ref 3.87–5.11)
RDW: 13.6 % (ref 11.5–15.5)
WBC: 8.8 10*3/uL (ref 4.0–10.5)

## 2020-04-07 LAB — LIPID PANEL
Cholesterol: 217 mg/dL — ABNORMAL HIGH (ref 0–200)
HDL: 102.3 mg/dL (ref >39.00)
LDL Cholesterol: 102 mg/dL — ABNORMAL HIGH (ref 0–99)
NonHDL: 115.07
Total CHOL/HDL Ratio: 2
Triglycerides: 66 mg/dL (ref 0.0–149.0)
VLDL: 13.2 mg/dL (ref 0.0–40.0)

## 2020-04-07 LAB — VITAMIN D 25 HYDROXY (VIT D DEFICIENCY, FRACTURES): VITD: 34.5 ng/mL (ref 30.00–100.00)

## 2020-04-07 LAB — TSH: TSH: 1.58 u[IU]/mL (ref 0.35–4.50)

## 2020-04-07 LAB — VITAMIN B12: Vitamin B-12: 286 pg/mL (ref 211–911)

## 2020-04-07 LAB — FOLATE: Folate: 18 ng/mL (ref >5.9)

## 2020-04-07 NOTE — Progress Notes (Signed)
Subjective:  Patient ID: Erin Kim, female    DOB: 1951/01/23  Age: 69 y.o. MRN: [id number]  CC: Annual Exam and Hyperlipidemia  This visit occurred during the SARS-CoV-2 public health emergency.  Safety protocols were in place, including screening questions prior to the visit, additional usage of staff PPE, and extensive cleaning of exam room while observing appropriate contact time as indicated for disinfecting solutions.    HPI Erin Kim presents for a CPX.  She complains of fatigue. She felt better when she took a B12 supplement.  Outpatient Medications Prior to Visit  Medication Sig Dispense Refill  . Flaxseed, Linseed, (FLAX SEED OIL PO) Take 1 tablespoon in juice daily.    . Multiple Vitamin (MULTIVITAMIN) tablet Take 1 tablet by mouth daily.    . cyanocobalamin 2000 MCG tablet Take 1 tablet (2,000 mcg total) by mouth daily. 90 tablet 1   No facility-administered medications prior to visit.    ROS Review of Systems  Constitutional: Positive for fatigue. Negative for appetite change, chills, diaphoresis and unexpected weight change.  HENT: Negative.   Eyes: Negative for visual disturbance.  Respiratory: Negative for cough, chest tightness, shortness of breath and wheezing.   Cardiovascular: Negative for chest pain, palpitations and leg swelling.  Gastrointestinal: Negative for abdominal pain, constipation, diarrhea, nausea and vomiting.  Endocrine: Negative for cold intolerance and heat intolerance.  Genitourinary: Negative.  Negative for difficulty urinating.  Musculoskeletal: Negative.  Negative for back pain, myalgias and neck pain.  Skin: Negative for color change, pallor and rash.  Neurological: Negative.  Negative for dizziness, weakness, light-headedness and headaches.  Hematological: Negative for adenopathy. Does not bruise/bleed easily.  Psychiatric/Behavioral: Negative.     Objective:  BP 132/80 (BP Location: Left Arm, Patient  Position: Sitting, Cuff Size: Normal)   Pulse 81   Temp 98.4 F (36.9 C) (Oral)   Ht 5\' 2"  (1.575 m)   Wt 119 lb (54 kg)   LMP 12/19/2001 (Approximate)   SpO2 98%   BMI 21.77 kg/m   BP Readings from Last 3 Encounters:  04/07/20 132/80  02/26/19 120/60  03/27/17 122/70    Wt Readings from Last 3 Encounters:  04/07/20 119 lb (54 kg)  05/30/19 119 lb (54 kg)  02/26/19 114 lb 8 oz (51.9 kg)    Physical Exam Vitals reviewed.  Constitutional:      Appearance: Normal appearance.  HENT:     Nose: Nose normal.     Mouth/Throat:     Mouth: Mucous membranes are moist.  Eyes:     General: No scleral icterus.    Conjunctiva/sclera: Conjunctivae normal.  Cardiovascular:     Rate and Rhythm: Normal rate and regular rhythm.     Heart sounds: No murmur.  Pulmonary:     Effort: Pulmonary effort is normal.     Breath sounds: No stridor. No wheezing, rhonchi or rales.  Abdominal:     General: Abdomen is flat. Bowel sounds are normal.     Palpations: There is no hepatomegaly, splenomegaly or mass.     Tenderness: There is no abdominal tenderness. There is no guarding.  Musculoskeletal:        General: Normal range of motion.     Cervical back: Neck supple.     Right lower leg: No edema.     Left lower leg: No edema.  Lymphadenopathy:     Cervical: No cervical adenopathy.  Skin:    General: Skin is warm and dry.  Coloration: Skin is not pale.  Neurological:     General: No focal deficit present.     Mental Status: She is alert.  Psychiatric:        Mood and Affect: Mood normal.        Behavior: Behavior normal.     Lab Results  Component Value Date   WBC 8.8 04/07/2020   HGB 13.7 04/07/2020   HCT 41.3 04/07/2020   PLT 244.0 04/07/2020   GLUCOSE 89 02/26/2019   CHOL 217 (H) 04/07/2020   TRIG 66.0 04/07/2020   HDL 102.30 04/07/2020   LDLDIRECT 95.6 02/27/2012   LDLCALC 102 (H) 04/07/2020   ALT 16 04/07/2020   AST 17 04/07/2020   NA 137 02/26/2019   K 4.0  02/26/2019   CL 100 02/26/2019   CREATININE 0.72 02/26/2019   BUN 11 02/26/2019   CO2 29 02/26/2019   TSH 1.58 04/07/2020    MM Digital Screening  Result Date: 06/04/2019 CLINICAL DATA:  Screening. EXAM: DIGITAL SCREENING BILATERAL MAMMOGRAM WITH CAD COMPARISON:  Previous exam(s). ACR Breast Density Category b: There are scattered areas of fibroglandular density. FINDINGS: There are no findings suspicious for malignancy. Images were processed with CAD. IMPRESSION: No mammographic evidence of malignancy. A result letter of this screening mammogram will be mailed directly to the patient. RECOMMENDATION: Screening mammogram in one year. (Code:SM-B-01Y) BI-RADS CATEGORY  1: Negative. Electronically Signed   By: Ammie Ferrier M.D.   On: 06/04/2019 15:52    Assessment & Plan:   Erin Kim was seen today for annual exam and hyperlipidemia.  Diagnoses and all orders for this visit:  B12 deficiency- Her B12 level is in the low/normal range. Will restart oral B12 replacement therapy. -     CBC with Differential/Platelet; Future -     Vitamin B12; Future -     Folate; Future -     Folate -     Vitamin B12 -     CBC with Differential/Platelet -     cyanocobalamin 2000 MCG tablet; Take 1 tablet (2,000 mcg total) by mouth daily.  Hyperlipidemia LDL goal <130- Statin therapy is not indicated. -     Lipid panel; Future -     Hepatic function panel; Future -     TSH; Future -     TSH -     Hepatic function panel -     Lipid panel  Vitamin D deficiency- Her Vit D level is normal now. -     VITAMIN D 25 Hydroxy (Vit-D Deficiency, Fractures); Future -     VITAMIN D 25 Hydroxy (Vit-D Deficiency, Fractures)  Routine general medical examination at a health care facility- Exam completed, labs reviewed, vaccines reviewed and updated, cervical cancer screening is not indicated, screening for breast and colon cancer up-to-date.  Patient education material was given.  Need for pneumococcal  vaccination -     Pneumococcal polysaccharide vaccine 23-valent greater than or equal to 2yo subcutaneous/IM   I am having Erin Kim maintain her (Flaxseed, Linseed, (FLAX SEED OIL PO)), multivitamin, and cyanocobalamin.  Meds ordered this encounter  Medications  . cyanocobalamin 2000 MCG tablet    Sig: Take 1 tablet (2,000 mcg total) by mouth daily.    Dispense:  90 tablet    Refill:  1     Follow-up: Return in about 6 months (around 10/07/2020).  Erin Calico, MD

## 2020-04-07 NOTE — Patient Instructions (Signed)
Health Maintenance, Female Adopting a healthy lifestyle and getting preventive care are important in promoting health and wellness. Ask your health care provider about:  The right schedule for you to have regular tests and exams.  Things you can do on your own to prevent diseases and keep yourself healthy. What should I know about diet, weight, and exercise? Eat a healthy diet   Eat a diet that includes plenty of vegetables, fruits, low-fat dairy products, and lean protein.  Do not eat a lot of foods that are high in solid fats, added sugars, or sodium. Maintain a healthy weight Body mass index (BMI) is used to identify weight problems. It estimates body fat based on height and weight. Your health care provider can help determine your BMI and help you achieve or maintain a healthy weight. Get regular exercise Get regular exercise. This is one of the most important things you can do for your health. Most adults should:  Exercise for at least 150 minutes each week. The exercise should increase your heart rate and make you sweat (moderate-intensity exercise).  Do strengthening exercises at least twice a week. This is in addition to the moderate-intensity exercise.  Spend less time sitting. Even light physical activity can be beneficial. Watch cholesterol and blood lipids Have your blood tested for lipids and cholesterol at 67 years of age, then have this test every 5 years. Have your cholesterol levels checked more often if:  Your lipid or cholesterol levels are high.  You are older than 67 years of age.  You are at high risk for heart disease. What should I know about cancer screening? Depending on your health history and family history, you may need to have cancer screening at various ages. This may include screening for:  Breast cancer.  Cervical cancer.  Colorectal cancer.  Skin cancer.  Lung cancer. What should I know about heart disease, diabetes, and high blood  pressure? Blood pressure and heart disease  High blood pressure causes heart disease and increases the risk of stroke. This is more likely to develop in people who have high blood pressure readings, are of African descent, or are overweight.  Have your blood pressure checked: ? Every 3-5 years if you are 18-39 years of age. ? Every year if you are 40 years old or older. Diabetes Have regular diabetes screenings. This checks your fasting blood sugar level. Have the screening done:  Once every three years after age 40 if you are at a normal weight and have a low risk for diabetes.  More often and at a younger age if you are overweight or have a high risk for diabetes. What should I know about preventing infection? Hepatitis B If you have a higher risk for hepatitis B, you should be screened for this virus. Talk with your health care provider to find out if you are at risk for hepatitis B infection. Hepatitis C Testing is recommended for:  Everyone born from 1945 through 1965.  Anyone with known risk factors for hepatitis C. Sexually transmitted infections (STIs)  Get screened for STIs, including gonorrhea and chlamydia, if: ? You are sexually active and are younger than 67 years of age. ? You are older than 67 years of age and your health care provider tells you that you are at risk for this type of infection. ? Your sexual activity has changed since you were last screened, and you are at increased risk for chlamydia or gonorrhea. Ask your health care provider if   you are at risk.  Ask your health care provider about whether you are at high risk for HIV. Your health care provider may recommend a prescription medicine to help prevent HIV infection. If you choose to take medicine to prevent HIV, you should first get tested for HIV. You should then be tested every 3 months for as long as you are taking the medicine. Pregnancy  If you are about to stop having your period (premenopausal) and  you may become pregnant, seek counseling before you get pregnant.  Take 400 to 800 micrograms (mcg) of folic acid every day if you become pregnant.  Ask for birth control (contraception) if you want to prevent pregnancy. Osteoporosis and menopause Osteoporosis is a disease in which the bones lose minerals and strength with aging. This can result in bone fractures. If you are 65 years old or older, or if you are at risk for osteoporosis and fractures, ask your health care provider if you should:  Be screened for bone loss.  Take a calcium or vitamin D supplement to lower your risk of fractures.  Be given hormone replacement therapy (HRT) to treat symptoms of menopause. Follow these instructions at home: Lifestyle  Do not use any products that contain nicotine or tobacco, such as cigarettes, e-cigarettes, and chewing tobacco. If you need help quitting, ask your health care provider.  Do not use street drugs.  Do not share needles.  Ask your health care provider for help if you need support or information about quitting drugs. Alcohol use  Do not drink alcohol if: ? Your health care provider tells you not to drink. ? You are pregnant, may be pregnant, or are planning to become pregnant.  If you drink alcohol: ? Limit how much you use to 0-1 drink a day. ? Limit intake if you are breastfeeding.  Be aware of how much alcohol is in your drink. In the [place] one drink equals one 12 oz bottle of beer (355 mL), one 5 oz glass of wine (148 mL), or one 1 oz glass of hard liquor (44 mL). General instructions  Schedule regular health, dental, and eye exams.  Stay current with your vaccines.  Tell your health care provider if: ? You often feel depressed. ? You have ever been abused or do not feel safe at home. Summary  Adopting a healthy lifestyle and getting preventive care are important in promoting health and wellness.  Follow your health care provider's instructions about healthy  diet, exercising, and getting tested or screened for diseases.  Follow your health care provider's instructions on monitoring your cholesterol and blood pressure. This information is not intended to replace advice given to you by your health care provider. Make sure you discuss any questions you have with your health care provider. Document Revised: 11/28/2018 Document Reviewed: 11/28/2018 Elsevier Patient Education  2020 [employer] Inc[employer]  

## 2020-04-08 ENCOUNTER — Encounter: Payer: Self-pay | Admitting: Internal Medicine

## 2020-04-08 MED ORDER — CYANOCOBALAMIN 2000 MCG PO TABS: 2000.0000 ug | ORAL_TABLET | Freq: Every day | ORAL | 1 refills | Status: DC

## 2020-05-07 DIAGNOSIS — H524 Presbyopia: Secondary | ICD-10-CM | POA: Diagnosis not present

## 2020-07-14 DIAGNOSIS — R69 Illness, unspecified: Secondary | ICD-10-CM | POA: Diagnosis not present

## 2020-08-13 DIAGNOSIS — D239 Other benign neoplasm of skin, unspecified: Secondary | ICD-10-CM | POA: Diagnosis not present

## 2020-08-13 DIAGNOSIS — D4701 Cutaneous mastocytosis: Secondary | ICD-10-CM | POA: Diagnosis not present

## 2020-08-13 DIAGNOSIS — L821 Other seborrheic keratosis: Secondary | ICD-10-CM | POA: Diagnosis not present

## 2020-08-13 DIAGNOSIS — L578 Other skin changes due to chronic exposure to nonionizing radiation: Secondary | ICD-10-CM | POA: Diagnosis not present

## 2020-08-13 DIAGNOSIS — D225 Melanocytic nevi of trunk: Secondary | ICD-10-CM | POA: Diagnosis not present

## 2020-08-18 DIAGNOSIS — D4701 Cutaneous mastocytosis: Secondary | ICD-10-CM | POA: Diagnosis not present

## 2021-01-14 ENCOUNTER — Telehealth: Payer: Self-pay | Admitting: Internal Medicine

## 2021-01-14 NOTE — Telephone Encounter (Signed)
LVM for pt to rtn my call to schedule awv with nha. Please schedule this appt if pt call the office.

## 2021-01-26 ENCOUNTER — Ambulatory Visit: Payer: Medicare HMO

## 2021-04-13 ENCOUNTER — Ambulatory Visit (INDEPENDENT_AMBULATORY_CARE_PROVIDER_SITE_OTHER): Payer: Medicare HMO | Admitting: Internal Medicine

## 2021-04-13 ENCOUNTER — Other Ambulatory Visit: Payer: Self-pay

## 2021-04-13 ENCOUNTER — Encounter: Payer: Self-pay | Admitting: Internal Medicine

## 2021-04-13 VITALS — BP 124/70 | HR 82 | Temp 98.2°F | Ht 62.0 in | Wt 114.0 lb

## 2021-04-13 DIAGNOSIS — Z1231 Encounter for screening mammogram for malignant neoplasm of breast: Secondary | ICD-10-CM

## 2021-04-13 DIAGNOSIS — E559 Vitamin D deficiency, unspecified: Secondary | ICD-10-CM | POA: Diagnosis not present

## 2021-04-13 DIAGNOSIS — E785 Hyperlipidemia, unspecified: Secondary | ICD-10-CM

## 2021-04-13 DIAGNOSIS — M8589 Other specified disorders of bone density and structure, multiple sites: Secondary | ICD-10-CM | POA: Diagnosis not present

## 2021-04-13 DIAGNOSIS — Z0000 Encounter for general adult medical examination without abnormal findings: Secondary | ICD-10-CM | POA: Diagnosis not present

## 2021-04-13 DIAGNOSIS — E2839 Other primary ovarian failure: Secondary | ICD-10-CM

## 2021-04-13 DIAGNOSIS — E538 Deficiency of other specified B group vitamins: Secondary | ICD-10-CM

## 2021-04-13 LAB — CBC WITH DIFFERENTIAL/PLATELET
Basophils Absolute: 0 10*3/uL (ref 0.0–0.1)
Basophils Relative: 0.6 % (ref 0.0–3.0)
Eosinophils Absolute: 0.2 10*3/uL (ref 0.0–0.7)
Eosinophils Relative: 2.9 % (ref 0.0–5.0)
HCT: 40.5 % (ref 36.0–46.0)
Hemoglobin: 13.5 g/dL (ref 12.0–15.0)
Lymphocytes Relative: 26.9 % (ref 12.0–46.0)
Lymphs Abs: 1.9 10*3/uL (ref 0.7–4.0)
MCHC: 33.5 g/dL (ref 30.0–36.0)
MCV: 90.3 fl (ref 78.0–100.0)
Monocytes Absolute: 0.6 10*3/uL (ref 0.1–1.0)
Monocytes Relative: 8.3 % (ref 3.0–12.0)
Neutro Abs: 4.3 10*3/uL (ref 1.4–7.7)
Neutrophils Relative %: 61.3 % (ref 43.0–77.0)
Platelets: 236 10*3/uL (ref 150.0–400.0)
RBC: 4.48 Mil/uL (ref 3.87–5.11)
RDW: 13.9 % (ref 11.5–15.5)
WBC: 7 10*3/uL (ref 4.0–10.5)

## 2021-04-13 LAB — BASIC METABOLIC PANEL
BUN: 13 mg/dL (ref 6–23)
CO2: 30 mEq/L (ref 19–32)
Calcium: 9.2 mg/dL (ref 8.4–10.5)
Chloride: 101 mEq/L (ref 96–112)
Creatinine, Ser: 0.67 mg/dL (ref 0.40–1.20)
GFR: 90.36 mL/min (ref >60.00)
Glucose, Bld: 98 mg/dL (ref 70–99)
Potassium: 4.8 mEq/L (ref 3.5–5.1)
Sodium: 137 mEq/L (ref 135–145)

## 2021-04-13 LAB — VITAMIN B12: Vitamin B-12: 710 pg/mL (ref 211–911)

## 2021-04-13 LAB — HEPATIC FUNCTION PANEL
ALT: 12 U/L (ref 0–35)
AST: 17 U/L (ref 0–37)
Albumin: 4.2 g/dL (ref 3.5–5.2)
Alkaline Phosphatase: 55 U/L (ref 39–117)
Bilirubin, Direct: 0.1 mg/dL (ref 0.0–0.3)
Total Bilirubin: 0.7 mg/dL (ref 0.2–1.2)
Total Protein: 7.1 g/dL (ref 6.0–8.3)

## 2021-04-13 LAB — LIPID PANEL
Cholesterol: 199 mg/dL (ref 0–200)
HDL: 92.1 mg/dL (ref >39.00)
LDL Cholesterol: 97 mg/dL (ref 0–99)
NonHDL: 106.63
Total CHOL/HDL Ratio: 2
Triglycerides: 47 mg/dL (ref 0.0–149.0)
VLDL: 9.4 mg/dL (ref 0.0–40.0)

## 2021-04-13 LAB — VITAMIN D 25 HYDROXY (VIT D DEFICIENCY, FRACTURES): VITD: 38.71 ng/mL (ref 30.00–100.00)

## 2021-04-13 LAB — FOLATE: Folate: >23.6 ng/mL (ref >5.9)

## 2021-04-13 NOTE — Progress Notes (Signed)
Subjective:  Patient ID: Erin Kim Kim, female    DOB: March 19, 1951  Age: 70 y.o. MRN: [id number]  CC: Annual Exam  This visit occurred during the SARS-CoV-2 public health emergency.  Safety protocols were in place, including screening questions prior to the visit, additional usage of staff PPE, and extensive cleaning of exam room while observing appropriate contact time as indicated for disinfecting solutions.    HPI Erin Kim Kim presents for a CPX and f/up -  She has a history of B12 deficiency but is no longer taking the oral B12 supplement.  She complains of arthralgias but denies paresthesias.  She is active and denies any recent episodes of chest pain, shortness of breath, palpitations, edema, or fatigue.  Outpatient Medications Prior to Visit  Medication Sig Dispense Refill  . cyanocobalamin 2000 MCG tablet Take 1 tablet (2,000 mcg total) by mouth daily. 90 tablet 1  . Flaxseed, Linseed, (FLAX SEED OIL PO) Take 1 tablespoon in juice daily.    . Multiple Vitamin (MULTIVITAMIN) tablet Take 1 tablet by mouth daily.     No facility-administered medications prior to visit.    ROS Review of Systems  Constitutional: Negative for appetite change, diaphoresis, fatigue and unexpected weight change.  HENT: Negative.  Negative for trouble swallowing.   Eyes: Negative for visual disturbance.  Respiratory: Negative for cough, chest tightness, shortness of breath and wheezing.   Cardiovascular: Negative for chest pain, palpitations and leg swelling.  Gastrointestinal: Negative for abdominal pain, diarrhea, nausea and vomiting.  Endocrine: Negative.   Genitourinary: Negative.  Negative for difficulty urinating.  Musculoskeletal: Positive for arthralgias. Negative for back pain and myalgias.  Neurological: Negative.  Negative for dizziness, weakness, light-headedness, numbness and headaches.  Hematological: Negative for adenopathy. Does not bruise/bleed easily.   Psychiatric/Behavioral: Negative.     Objective:  BP 124/70 (BP Location: Left Arm, Patient Position: Sitting, Cuff Size: Normal)   Pulse 82   Temp 98.2 F (36.8 C) (Oral)   Ht 5\' 2"  (1.575 m)   Wt 114 lb (51.7 kg)   LMP 12/19/2001 (Approximate)   SpO2 98%   BMI 20.85 kg/m   BP Readings from Last 3 Encounters:  04/13/21 124/70  04/07/20 132/80  02/26/19 120/60    Wt Readings from Last 3 Encounters:  04/13/21 114 lb (51.7 kg)  04/07/20 119 lb (54 kg)  05/30/19 119 lb (54 kg)    Physical Exam Vitals reviewed.  Constitutional:      Appearance: Normal appearance.  HENT:     Nose: Nose normal.     Mouth/Throat:     Mouth: Mucous membranes are moist.  Eyes:     General: No scleral icterus.    Conjunctiva/sclera: Conjunctivae normal.  Cardiovascular:     Rate and Rhythm: Normal rate and regular rhythm.     Heart sounds: No murmur heard.   Pulmonary:     Effort: Pulmonary effort is normal.     Breath sounds: No stridor. No wheezing, rhonchi or rales.  Abdominal:     General: Abdomen is flat. Bowel sounds are normal. There is no distension.     Palpations: Abdomen is soft. There is no hepatomegaly, splenomegaly or mass.     Tenderness: There is no abdominal tenderness.  Musculoskeletal:        General: Normal range of motion.     Cervical back: Neck supple.     Right lower leg: No edema.     Left lower leg: No edema.  Lymphadenopathy:  Cervical: No cervical adenopathy.  Skin:    General: Skin is warm and dry.     Coloration: Skin is not pale.  Neurological:     General: No focal deficit present.     Mental Status: She is alert.  Psychiatric:        Mood and Affect: Mood normal.        Behavior: Behavior normal.     Lab Results  Component Value Date   WBC 7.0 04/13/2021   HGB 13.5 04/13/2021   HCT 40.5 04/13/2021   PLT 236.0 04/13/2021   GLUCOSE 98 04/13/2021   CHOL 199 04/13/2021   TRIG 47.0 04/13/2021   HDL 92.10 04/13/2021   LDLDIRECT 95.6  02/27/2012   LDLCALC 97 04/13/2021   ALT 12 04/13/2021   AST 17 04/13/2021   NA 137 04/13/2021   K 4.8 04/13/2021   CL 101 04/13/2021   CREATININE 0.67 04/13/2021   BUN 13 04/13/2021   CO2 30 04/13/2021   TSH 1.58 04/07/2020    MM Digital Screening  Result Date: 06/04/2019 CLINICAL DATA:  Screening. EXAM: DIGITAL SCREENING BILATERAL MAMMOGRAM WITH CAD COMPARISON:  Previous exam(s). ACR Breast Density Category b: There are scattered areas of fibroglandular density. FINDINGS: There are no findings suspicious for malignancy. Images were processed with CAD. IMPRESSION: No mammographic evidence of malignancy. A result letter of this screening mammogram will be mailed directly to the patient. RECOMMENDATION: Screening mammogram in one year. (Code:SM-B-01Y) BI-RADS CATEGORY  1: Negative. Electronically Signed   By: Ammie Ferrier M.D.   On: 06/04/2019 15:52    Assessment & Plan:   Erin Kim was seen today for annual exam.  Diagnoses and all orders for this visit:  B12 deficiency- Her H/H, B12, folate levels are normal now. -     CBC with Differential/Platelet; Future -     Vitamin B12; Future -     Folate; Future -     Folate -     Vitamin B12 -     CBC with Differential/Platelet  Hyperlipidemia LDL goal <130- She has a low ASCVD risk score. Statin therapy is not indicated. -     Lipid panel; Future -     Hepatic function panel; Future -     Hepatic function panel -     Lipid panel  Vitamin D deficiency- Her Vit D level is normal. -     VITAMIN D 25 Hydroxy (Vit-D Deficiency, Fractures); Future -     VITAMIN D 25 Hydroxy (Vit-D Deficiency, Fractures)  Routine general medical examination at a health care facility- Exam completed, labs reviewed, vaccines reviewed, cancer screenings addressed, pt ed material was given.  Osteopenia of multiple sites -     Basic metabolic panel; Future -     Basic metabolic panel -     DG Bone Density; Future  Visit for screening mammogram -      MM DIGITAL SCREENING BILATERAL; Future  Estrogen deficiency -     DG Bone Density; Future   I am having Erin Kim Kim. Reynaldo Minium maintain her (Flaxseed, Linseed, (FLAX SEED OIL PO)), multivitamin, and cyanocobalamin.  No orders of the defined types were placed in this encounter.    Follow-up: Return in about 6 months (around 10/13/2021).  Scarlette Calico, MD

## 2021-04-13 NOTE — Patient Instructions (Signed)
Health Maintenance, Female Adopting a healthy lifestyle and getting preventive care are important in promoting health and wellness. Ask your health care provider about:  The right schedule for you to have regular tests and exams.  Things you can do on your own to prevent diseases and keep yourself healthy. What should I know about diet, weight, and exercise? Eat a healthy diet  Eat a diet that includes plenty of vegetables, fruits, low-fat dairy products, and lean protein.  Do not eat a lot of foods that are high in solid fats, added sugars, or sodium.   Maintain a healthy weight Body mass index (BMI) is used to identify weight problems. It estimates body fat based on height and weight. Your health care provider can help determine your BMI and help you achieve or maintain a healthy weight. Get regular exercise Get regular exercise. This is one of the most important things you can do for your health. Most adults should:  Exercise for at least 150 minutes each week. The exercise should increase your heart rate and make you sweat (moderate-intensity exercise).  Do strengthening exercises at least twice a week. This is in addition to the moderate-intensity exercise.  Spend less time sitting. Even light physical activity can be beneficial. Watch cholesterol and blood lipids Have your blood tested for lipids and cholesterol at 70 years of age, then have this test every 5 years. Have your cholesterol levels checked more often if:  Your lipid or cholesterol levels are high.  You are older than 68 years of age.  You are at high risk for heart disease. What should I know about cancer screening? Depending on your health history and family history, you may need to have cancer screening at various ages. This may include screening for:  Breast cancer.  Cervical cancer.  Colorectal cancer.  Skin cancer.  Lung cancer. What should I know about heart disease, diabetes, and high blood  pressure? Blood pressure and heart disease  High blood pressure causes heart disease and increases the risk of stroke. This is more likely to develop in people who have high blood pressure readings, are of African descent, or are overweight.  Have your blood pressure checked: ? Every 3-5 years if you are 18-39 years of age. ? Every year if you are 40 years old or older. Diabetes Have regular diabetes screenings. This checks your fasting blood sugar level. Have the screening done:  Once every three years after age 40 if you are at a normal weight and have a low risk for diabetes.  More often and at a younger age if you are overweight or have a high risk for diabetes. What should I know about preventing infection? Hepatitis B If you have a higher risk for hepatitis B, you should be screened for this virus. Talk with your health care provider to find out if you are at risk for hepatitis B infection. Hepatitis C Testing is recommended for:  Everyone born from 1945 through 1965.  Anyone with known risk factors for hepatitis C. Sexually transmitted infections (STIs)  Get screened for STIs, including gonorrhea and chlamydia, if: ? You are sexually active and are younger than 68 years of age. ? You are older than 68 years of age and your health care provider tells you that you are at risk for this type of infection. ? Your sexual activity has changed since you were last screened, and you are at increased risk for chlamydia or gonorrhea. Ask your health care provider   if you are at risk.  Ask your health care provider about whether you are at high risk for HIV. Your health care provider may recommend a prescription medicine to help prevent HIV infection. If you choose to take medicine to prevent HIV, you should first get tested for HIV. You should then be tested every 3 months for as long as you are taking the medicine. Pregnancy  If you are about to stop having your period (premenopausal) and  you may become pregnant, seek counseling before you get pregnant.  Take 400 to 800 micrograms (mcg) of folic acid every day if you become pregnant.  Ask for birth control (contraception) if you want to prevent pregnancy. Osteoporosis and menopause Osteoporosis is a disease in which the bones lose minerals and strength with aging. This can result in bone fractures. If you are 65 years old or older, or if you are at risk for osteoporosis and fractures, ask your health care provider if you should:  Be screened for bone loss.  Take a calcium or vitamin D supplement to lower your risk of fractures.  Be given hormone replacement therapy (HRT) to treat symptoms of menopause. Follow these instructions at home: Lifestyle  Do not use any products that contain nicotine or tobacco, such as cigarettes, e-cigarettes, and chewing tobacco. If you need help quitting, ask your health care provider.  Do not use street drugs.  Do not share needles.  Ask your health care provider for help if you need support or information about quitting drugs. Alcohol use  Do not drink alcohol if: ? Your health care provider tells you not to drink. ? You are pregnant, may be pregnant, or are planning to become pregnant.  If you drink alcohol: ? Limit how much you use to 0-1 drink a day. ? Limit intake if you are breastfeeding.  Be aware of how much alcohol is in your drink. In the [place] one drink equals one 12 oz bottle of beer (355 mL), one 5 oz glass of wine (148 mL), or one 1 oz glass of hard liquor (44 mL). General instructions  Schedule regular health, dental, and eye exams.  Stay current with your vaccines.  Tell your health care provider if: ? You often feel depressed. ? You have ever been abused or do not feel safe at home. Summary  Adopting a healthy lifestyle and getting preventive care are important in promoting health and wellness.  Follow your health care provider's instructions about healthy  diet, exercising, and getting tested or screened for diseases.  Follow your health care provider's instructions on monitoring your cholesterol and blood pressure. This information is not intended to replace advice given to you by your health care provider. Make sure you discuss any questions you have with your health care provider. Document Revised: 11/28/2018 Document Reviewed: 11/28/2018 Elsevier Patient Education  2021 Elsevier Inc.  

## 2021-04-14 DIAGNOSIS — E2839 Other primary ovarian failure: Secondary | ICD-10-CM | POA: Insufficient documentation

## 2021-06-23 ENCOUNTER — Other Ambulatory Visit: Payer: Self-pay | Admitting: Internal Medicine

## 2021-06-23 DIAGNOSIS — Z1231 Encounter for screening mammogram for malignant neoplasm of breast: Secondary | ICD-10-CM

## 2021-06-28 ENCOUNTER — Ambulatory Visit: Payer: Medicare HMO

## 2021-08-16 ENCOUNTER — Ambulatory Visit
Admission: RE | Admit: 2021-08-16 | Discharge: 2021-08-16 | Disposition: A | Discharge status: Home / Self Care | Payer: Medicare HMO | Source: Ambulatory Visit | Attending: Internal Medicine | Admitting: Internal Medicine

## 2021-08-16 ENCOUNTER — Other Ambulatory Visit: Payer: Self-pay

## 2021-08-16 DIAGNOSIS — Z1231 Encounter for screening mammogram for malignant neoplasm of breast: Secondary | ICD-10-CM

## 2021-08-18 DIAGNOSIS — D4701 Cutaneous mastocytosis: Secondary | ICD-10-CM | POA: Diagnosis not present

## 2021-08-18 DIAGNOSIS — D239 Other benign neoplasm of skin, unspecified: Secondary | ICD-10-CM | POA: Diagnosis not present

## 2021-08-18 DIAGNOSIS — L821 Other seborrheic keratosis: Secondary | ICD-10-CM | POA: Diagnosis not present

## 2021-08-18 DIAGNOSIS — D225 Melanocytic nevi of trunk: Secondary | ICD-10-CM | POA: Diagnosis not present

## 2021-08-18 DIAGNOSIS — L578 Other skin changes due to chronic exposure to nonionizing radiation: Secondary | ICD-10-CM | POA: Diagnosis not present

## 2021-08-24 DIAGNOSIS — D4701 Cutaneous mastocytosis: Secondary | ICD-10-CM | POA: Diagnosis not present

## 2021-10-06 ENCOUNTER — Other Ambulatory Visit: Payer: Self-pay | Admitting: Internal Medicine

## 2021-10-06 DIAGNOSIS — M8589 Other specified disorders of bone density and structure, multiple sites: Secondary | ICD-10-CM

## 2021-10-06 DIAGNOSIS — E2839 Other primary ovarian failure: Secondary | ICD-10-CM

## 2021-10-11 ENCOUNTER — Other Ambulatory Visit: Payer: Medicare HMO

## 2021-11-29 ENCOUNTER — Telehealth: Payer: Self-pay | Admitting: Internal Medicine

## 2021-11-29 NOTE — Telephone Encounter (Signed)
Pt's husband stated that pt is doing much better.  OV scheduled for 1/12 @ 3.40pm

## 2021-11-29 NOTE — Telephone Encounter (Signed)
Patient's spouse Mikki Santee states patient had an allergic reaction to an unknown allergen on 12-9  Caller requesting a call back to discuss allergist recommendations  Encouraged caller to schedule ov, caller declined

## 2021-12-30 ENCOUNTER — Ambulatory Visit: Payer: Medicare HMO | Admitting: Internal Medicine

## 2022-01-13 DIAGNOSIS — D4701 Cutaneous mastocytosis: Secondary | ICD-10-CM | POA: Diagnosis not present

## 2022-01-13 DIAGNOSIS — L2089 Other atopic dermatitis: Secondary | ICD-10-CM | POA: Diagnosis not present

## 2022-01-13 DIAGNOSIS — L253 Unspecified contact dermatitis due to other chemical products: Secondary | ICD-10-CM | POA: Diagnosis not present

## 2022-01-13 DIAGNOSIS — Z87892 Personal history of anaphylaxis: Secondary | ICD-10-CM | POA: Diagnosis not present

## 2022-01-20 DIAGNOSIS — D4701 Cutaneous mastocytosis: Secondary | ICD-10-CM | POA: Diagnosis not present

## 2022-01-20 DIAGNOSIS — Z87892 Personal history of anaphylaxis: Secondary | ICD-10-CM | POA: Diagnosis not present

## 2022-01-27 DIAGNOSIS — D239 Other benign neoplasm of skin, unspecified: Secondary | ICD-10-CM | POA: Diagnosis not present

## 2022-02-28 ENCOUNTER — Ambulatory Visit
Admission: RE | Admit: 2022-02-28 | Discharge: 2022-02-28 | Disposition: A | Discharge status: Home / Self Care | Payer: Medicare HMO | Source: Ambulatory Visit | Attending: Internal Medicine | Admitting: Internal Medicine

## 2022-02-28 DIAGNOSIS — M810 Age-related osteoporosis without current pathological fracture: Secondary | ICD-10-CM | POA: Diagnosis not present

## 2022-02-28 DIAGNOSIS — M85852 Other specified disorders of bone density and structure, left thigh: Secondary | ICD-10-CM | POA: Diagnosis not present

## 2022-02-28 DIAGNOSIS — Z780 Asymptomatic menopausal state: Secondary | ICD-10-CM | POA: Diagnosis not present

## 2022-02-28 DIAGNOSIS — M8589 Other specified disorders of bone density and structure, multiple sites: Secondary | ICD-10-CM

## 2022-02-28 DIAGNOSIS — E2839 Other primary ovarian failure: Secondary | ICD-10-CM

## 2022-03-03 ENCOUNTER — Telehealth: Payer: Self-pay

## 2022-03-03 NOTE — Telephone Encounter (Signed)
Evenity VOB initiated via [email or web address]  New start  

## 2022-03-05 NOTE — Telephone Encounter (Signed)
Prior auth required for EVENITY  PA PROCESS DETAILS: PA is required. Please call [phone], or fax [phone] 

## 2022-03-10 NOTE — Telephone Encounter (Signed)
Prior auth initiated via [employer] ?[id number] ? ? ?

## 2022-03-11 NOTE — Telephone Encounter (Addendum)
03/18/22 Pt husband is calling after receiving a letter stating pt was denied coverage for Evenity. Holland Falling says that she has to try Forteo before trying Evenity. Pt husband states that she has not gotten the results of the Dexa sign.  ? ? ?03/11/22 [employer] Rep called stating that the pt would need to try a different product first. She states that she will be faxing over a form to be filled out and faxed back before making a decision. ? ? ?FYI  ?Forteo ?

## 2022-03-18 ENCOUNTER — Other Ambulatory Visit: Payer: Self-pay | Admitting: Internal Medicine

## 2022-03-18 DIAGNOSIS — M810 Age-related osteoporosis without current pathological fracture: Secondary | ICD-10-CM

## 2022-04-14 ENCOUNTER — Ambulatory Visit (INDEPENDENT_AMBULATORY_CARE_PROVIDER_SITE_OTHER): Payer: Medicare HMO | Admitting: Internal Medicine

## 2022-04-14 ENCOUNTER — Encounter: Payer: Self-pay | Admitting: Internal Medicine

## 2022-04-14 VITALS — BP 126/76 | HR 77 | Temp 97.9°F | Ht 62.0 in | Wt 116.0 lb

## 2022-04-14 DIAGNOSIS — E785 Hyperlipidemia, unspecified: Secondary | ICD-10-CM | POA: Diagnosis not present

## 2022-04-14 DIAGNOSIS — E538 Deficiency of other specified B group vitamins: Secondary | ICD-10-CM | POA: Diagnosis not present

## 2022-04-14 DIAGNOSIS — Z1211 Encounter for screening for malignant neoplasm of colon: Secondary | ICD-10-CM

## 2022-04-14 DIAGNOSIS — Z0000 Encounter for general adult medical examination without abnormal findings: Secondary | ICD-10-CM | POA: Diagnosis not present

## 2022-04-14 DIAGNOSIS — E559 Vitamin D deficiency, unspecified: Secondary | ICD-10-CM

## 2022-04-14 DIAGNOSIS — M810 Age-related osteoporosis without current pathological fracture: Secondary | ICD-10-CM | POA: Diagnosis not present

## 2022-04-14 DIAGNOSIS — Z23 Encounter for immunization: Secondary | ICD-10-CM

## 2022-04-14 LAB — FOLATE: Folate: 15.2 ng/mL (ref >5.9)

## 2022-04-14 LAB — LIPID PANEL
Cholesterol: 215 mg/dL — ABNORMAL HIGH (ref 0–200)
HDL: 101.8 mg/dL (ref >39.00)
LDL Cholesterol: 100 mg/dL — ABNORMAL HIGH (ref 0–99)
NonHDL: 113.4
Total CHOL/HDL Ratio: 2
Triglycerides: 69 mg/dL (ref 0.0–149.0)
VLDL: 13.8 mg/dL (ref 0.0–40.0)

## 2022-04-14 LAB — CBC WITH DIFFERENTIAL/PLATELET
Basophils Absolute: 0.1 10*3/uL (ref 0.0–0.1)
Basophils Relative: 0.7 % (ref 0.0–3.0)
Eosinophils Absolute: 0.1 10*3/uL (ref 0.0–0.7)
Eosinophils Relative: 1.3 % (ref 0.0–5.0)
HCT: 41 % (ref 36.0–46.0)
Hemoglobin: 13.6 g/dL (ref 12.0–15.0)
Lymphocytes Relative: 14 % (ref 12.0–46.0)
Lymphs Abs: 1.4 10*3/uL (ref 0.7–4.0)
MCHC: 33.3 g/dL (ref 30.0–36.0)
MCV: 90.7 fl (ref 78.0–100.0)
Monocytes Absolute: 0.7 10*3/uL (ref 0.1–1.0)
Monocytes Relative: 6.9 % (ref 3.0–12.0)
Neutro Abs: 8 10*3/uL — ABNORMAL HIGH (ref 1.4–7.7)
Neutrophils Relative %: 77.1 % — ABNORMAL HIGH (ref 43.0–77.0)
Platelets: 284 10*3/uL (ref 150.0–400.0)
RBC: 4.52 Mil/uL (ref 3.87–5.11)
RDW: 12.9 % (ref 11.5–15.5)
WBC: 10.3 10*3/uL (ref 4.0–10.5)

## 2022-04-14 LAB — BASIC METABOLIC PANEL
BUN: 13 mg/dL (ref 6–23)
CO2: 30 mEq/L (ref 19–32)
Calcium: 9.3 mg/dL (ref 8.4–10.5)
Chloride: 99 mEq/L (ref 96–112)
Creatinine, Ser: 0.6 mg/dL (ref 0.40–1.20)
GFR: 92.14 mL/min (ref >60.00)
Glucose, Bld: 100 mg/dL — ABNORMAL HIGH (ref 70–99)
Potassium: 4.8 mEq/L (ref 3.5–5.1)
Sodium: 135 mEq/L (ref 135–145)

## 2022-04-14 LAB — HEPATIC FUNCTION PANEL
ALT: 14 U/L (ref 0–35)
AST: 17 U/L (ref 0–37)
Albumin: 4.4 g/dL (ref 3.5–5.2)
Alkaline Phosphatase: 61 U/L (ref 39–117)
Bilirubin, Direct: 0.1 mg/dL (ref 0.0–0.3)
Total Bilirubin: 0.7 mg/dL (ref 0.2–1.2)
Total Protein: 7.4 g/dL (ref 6.0–8.3)

## 2022-04-14 LAB — VITAMIN B12: Vitamin B-12: 288 pg/mL (ref 211–911)

## 2022-04-14 LAB — VITAMIN D 25 HYDROXY (VIT D DEFICIENCY, FRACTURES): VITD: 30.26 ng/mL (ref 30.00–100.00)

## 2022-04-14 LAB — TSH: TSH: 1.5 u[IU]/mL (ref 0.35–5.50)

## 2022-04-14 NOTE — Patient Instructions (Signed)
Osteoporosis ? ?Osteoporosis happens when the bones become thin and less dense than normal. Osteoporosis makes bones more brittle and fragile and more likely to break (fracture). ?Over time, osteoporosis can cause your bones to become so weak that they fracture after a minor fall. Bones in the hip, wrist, and spine are most likely to fracture due to osteoporosis. ?What are the causes? ?The exact cause of this condition is not known. ?What increases the risk? ?You are more likely to develop this condition if you: ?Have family members with this condition. ?Have poor nutrition. ?Use the following: ?Steroid medicines, such as prednisone. ?Anti-seizure medicines. ?Nicotine or tobacco, such as cigarettes, e-cigarettes, and chewing tobacco. ?Are female. ?Are age 69 or older. ?Are not physically active (are sedentary). ?Are of European or Asian descent. ?Have a small body frame. ?What are the signs or symptoms? ?A fracture might be the first sign of osteoporosis, especially if the fracture results from a fall or injury that usually would not cause a bone to break. Other signs and symptoms include: ?Pain in the neck or low back. ?Stooped posture. ?Loss of height. ?How is this diagnosed? ?This condition may be diagnosed based on: ?Your medical history. ?A physical exam. ?A bone mineral density test, also called a DXA or DEXA test (dual-energy X-ray absorptiometry test). This test uses X-rays to measure the amount of minerals in your bones. ?How is this treated? ?This condition may be treated by: ?Making lifestyle changes, such as: ?Including foods with more calcium and vitamin D in your diet. ?Doing weight-bearing and muscle-strengthening exercises. ?Stopping tobacco use. ?Limiting alcohol intake. ?Taking medicine to slow the process of bone loss or to increase bone density. ?Taking daily supplements of calcium and vitamin D. ?Taking hormone replacement medicines, such as estrogen for women and testosterone for  men. ?Monitoring your levels of calcium and vitamin D. ?The goal of treatment is to strengthen your bones and lower your risk for a fracture. ?Follow these instructions at home: ?Eating and drinking ?Include calcium and vitamin D in your diet. Calcium is important for bone health, and vitamin D helps your body absorb calcium. Good sources of calcium and vitamin D include: ?Certain fatty fish, such as salmon and tuna. ?Products that have calcium and vitamin D added to them (are fortified), such as fortified cereals. ?Egg yolks. ?Cheese. ?Liver. ? ?Activity ?Do exercises as told by your health care provider. Ask your health care provider what exercises and activities are safe for you. You should do: ?Exercises that make you work against gravity (weight-bearing exercises), such as tai chi, yoga, or walking. ?Exercises to strengthen muscles, such as lifting weights. ?Lifestyle ?Do not drink alcohol if: ?Your health care provider tells you not to drink. ?You are pregnant, may be pregnant, or are planning to become pregnant. ?If you drink alcohol: ?Limit how much you use to: ?0-1 drink a day for women. ?0-2 drinks a day for men. ?Know how much alcohol is in your drink. In the [place] one drink equals one 12 oz bottle of beer (355 mL), one 5 oz glass of wine (148 mL), or one 1? oz glass of hard liquor (44 mL). ?Do not use any products that contain nicotine or tobacco, such as cigarettes, e-cigarettes, and chewing tobacco. If you need help quitting, ask your health care provider. ?Preventing falls ?Use devices to help you move around (mobility aids) as needed, such as canes, walkers, scooters, or crutches. ?Keep rooms well-lit and clutter-free. ?Remove tripping hazards from walkways, including cords and  throw rugs. ?Install grab bars in bathrooms and safety rails on stairs. ?Use rubber mats in the bathroom and other areas that are often wet or slippery. ?Wear closed-toe shoes that fit well and support your feet. Wear shoes  that have rubber soles or low heels. ?Review your medicines with your health care provider. Some medicines can cause dizziness or changes in blood pressure, which can increase your risk of falling. ?General instructions ?Take over-the-counter and prescription medicines only as told by your health care provider. ?Keep all follow-up visits. This is important. ?Contact a health care provider if: ?You have never been screened for osteoporosis and you are: ?A woman who is age 69 or older. ?A man who is age 21 or older. ?Get help right away if: ?You fall or injure yourself. ?Summary ?Osteoporosis is thinning and loss of density in your bones. This makes bones more brittle and fragile and more likely to break (fracture),even with minor falls. ?The goal of treatment is to strengthen your bones and lower your risk for a fracture. ?Include calcium and vitamin D in your diet. Calcium is important for bone health, and vitamin D helps your body absorb calcium. ?Talk with your health care provider about screening for osteoporosis if you are a woman who is age 69 or older, or a man who is age 69 or older. ?This information is not intended to replace advice given to you by your health care provider. Make sure you discuss any questions you have with your health care provider. ?Document Revised: 05/21/2020 Document Reviewed: 05/21/2020 ?Elsevier Patient Education ? [employer]. ? ?

## 2022-04-14 NOTE — Progress Notes (Signed)
? ?Subjective:  ?Patient ID: Erin Kim, female    DOB: November 21, 1951  Age: 71 y.o. MRN: [id number] ? ?CC: Annual Exam ? ? ?HPI ?Mohammed Kindle presents for a CPX and f/up -  ? ?She has felt well recently and offers no complaints. ? ?Outpatient Medications Prior to Visit  ?Medication Sig Dispense Refill  ? Flaxseed, Linseed, (FLAX SEED OIL PO) Take 1 tablespoon in juice daily.    ? Multiple Vitamin (MULTIVITAMIN) tablet Take 1 tablet by mouth daily.    ? cyanocobalamin 2000 MCG tablet Take 1 tablet (2,000 mcg total) by mouth daily. 90 tablet 1  ? ?No facility-administered medications prior to visit.  ? ? ?ROS ?Review of Systems  ?Constitutional: Negative.  Negative for diaphoresis and fatigue.  ?HENT: Negative.    ?Eyes: Negative.   ?Respiratory:  Negative for cough, shortness of breath and wheezing.   ?Cardiovascular:  Negative for chest pain, palpitations and leg swelling.  ?Gastrointestinal:  Negative for abdominal pain, constipation and diarrhea.  ?Endocrine: Negative.   ?Genitourinary: Negative.   ?Musculoskeletal:  Negative for arthralgias and myalgias.  ?Skin: Negative.   ?Neurological: Negative.   ?Hematological:  Negative for adenopathy. Does not bruise/bleed easily.  ?Psychiatric/Behavioral: Negative.    ? ?Objective:  ?BP 126/76 (BP Location: Left Arm, Patient Position: Sitting, Cuff Size: Normal)   Pulse 77   Temp 97.9 ?F (36.6 ?C) (Oral)   Ht '5\' 2"'$  (1.575 m)   Wt 116 lb (52.6 kg)   LMP 12/19/2001 (Approximate)   SpO2 98%   BMI 21.22 kg/m?  ? ?BP Readings from Last 3 Encounters:  ?04/14/22 126/76  ?04/13/21 124/70  ?04/07/20 132/80  ? ? ?Wt Readings from Last 3 Encounters:  ?04/14/22 116 lb (52.6 kg)  ?04/13/21 114 lb (51.7 kg)  ?04/07/20 119 lb (54 kg)  ? ? ?Physical Exam ?Vitals reviewed.  ?HENT:  ?   Nose: Nose normal.  ?   Mouth/Throat:  ?   Mouth: Mucous membranes are moist.  ?Eyes:  ?   General: No scleral icterus. ?   Conjunctiva/sclera: Conjunctivae normal.   ?Cardiovascular:  ?   Rate and Rhythm: Normal rate and regular rhythm.  ?   Heart sounds: No murmur heard. ?Pulmonary:  ?   Effort: Pulmonary effort is normal.  ?   Breath sounds: No stridor. No wheezing, rhonchi or rales.  ?Abdominal:  ?   General: Abdomen is flat.  ?   Palpations: There is no mass.  ?   Tenderness: There is no abdominal tenderness. There is no guarding.  ?   Hernia: No hernia is present.  ?Musculoskeletal:     ?   General: Normal range of motion.  ?   Cervical back: Neck supple.  ?   Right lower leg: No edema.  ?   Left lower leg: No edema.  ?Lymphadenopathy:  ?   Cervical: No cervical adenopathy.  ?Skin: ?   General: Skin is warm and dry.  ?Neurological:  ?   General: No focal deficit present.  ?   Mental Status: She is alert.  ?Psychiatric:     ?   Mood and Affect: Mood normal.     ?   Behavior: Behavior normal.  ? ? ?Lab Results  ?Component Value Date  ? WBC 10.3 04/14/2022  ? HGB 13.6 04/14/2022  ? HCT 41.0 04/14/2022  ? PLT 284.0 04/14/2022  ? GLUCOSE 100 (H) 04/14/2022  ? CHOL 215 (H) 04/14/2022  ? TRIG 69.0 04/14/2022  ?  HDL 101.80 04/14/2022  ? LDLDIRECT 95.6 02/27/2012  ? LDLCALC 100 (H) 04/14/2022  ? ALT 14 04/14/2022  ? AST 17 04/14/2022  ? NA 135 04/14/2022  ? K 4.8 04/14/2022  ? CL 99 04/14/2022  ? CREATININE 0.60 04/14/2022  ? BUN 13 04/14/2022  ? CO2 30 04/14/2022  ? TSH 1.50 04/14/2022  ? ? ?DG BONE DENSITY (DXA) ? ?Result Date: 02/28/2022 ?EXAM: DUAL X-RAY ABSORPTIOMETRY (DXA) FOR BONE MINERAL DENSITY IMPRESSION: Referring Physician:  Janith Lima Your patient completed a bone mineral density test using GE Lunar iDXA system (analysis version: 16). Technologist: [place] PATIENT: Name: Erin, Kim Patient ID: [id number] Birth Date: Oct 18, 1951 Height: 62.5 in. Sex: Female Measured: 02/28/2022 Weight: 114.0 lbs. Indications: Caucasian, Estrogen Deficient, Postmenopausal Fractures: NONE Treatments: None ASSESSMENT: The BMD measured at Forearm Radius 33% is 0.543 g/cm2 with a  T-score of -3.8. This patient is considered osteoporotic according to [facility]) criteria. The quality of the exam is good. Site Region Measured Date Measured Age YA BMD Significant CHANGE T-score Left Forearm Radius 33% 02/28/2024 68.4 -3.8 0.543 g/cm2 AP Spine L1-L4 02/28/2022 68.4 -3.4 0.776 g/cm2 DualFemur Neck Left 02/28/2022 68.4 -2.2 0.736 g/cm2 DualFemur Total Mean 02/28/2022 68.4 -1.4 0.834 g/cm2 World Health Organization [facility]) criteria for post-menopausal, Caucasian Women: Normal       T-score at or above -1 SD Osteopenia   T-score between -1 and -2.5 SD Osteoporosis T-score at or below -2.5 SD RECOMMENDATION: 1. All patients should optimize calcium and vitamin D intake. 2. Consider FDA-approved medical therapies in postmenopausal women and men aged 82 years and older, based on the following: a. A hip or vertebral (clinical or morphometric) fracture. b. T-score = -2.5 at the femoral neck or spine after appropriate evaluation to exclude secondary causes. c. Low bone mass (T-score between -1.0 and -2.5 at the femoral neck or spine) and a 10-year probability of a hip fracture = 3% or a 10-year probability of a major osteoporosis-related fracture = 20% based on the [place]-adapted WHO algorithm. d. Clinician judgment and/or patient preferences may indicate treatment for people with 10-year fracture probabilities above or below these levels. FOLLOW-UP: Patients with diagnosis of osteoporosis or at high risk for fracture should have regular bone mineral density tests.? Patients eligible for Medicare are allowed routine testing every 2 years.? The testing frequency can be increased to one year for patients who have rapidly progressing disease, are receiving or discontinuing medical therapy to restore bone mass, or have additional risk factors. I have reviewed this study and agree with the findings. [facility] Electronically Signed   By: Elmer Picker M.D.   On: 02/28/2022  14:31  ? ? ?Assessment & Plan:  ? ?Twana was seen today for annual exam. ? ?Diagnoses and all orders for this visit: ? ?B12 deficiency-her B12 is normal now. ?-     Vitamin B12; Future ?-     CBC with Differential/Platelet; Future ?-     Folate; Future ?-     Folate ?-     CBC with Differential/Platelet ?-     Vitamin B12 ? ?Age-related osteoporosis without current pathological fracture- Her insurance did not approve Evenity and she is not willing to do a daily Forteo injection.  I recommended Prolia every 6 months. ?-     Basic metabolic panel; Future ?-     Basic metabolic panel ? ?Routine general medical examination at a health care facility- Exam completed, labs reviewed-statin therapy is not indicated,  vaccines reviewed and updated, cancer screenings are up-to-date, patient education was given. ? ?Vitamin D deficiency ?-     VITAMIN D 25 Hydroxy (Vit-D Deficiency, Fractures); Future ?-     VITAMIN D 25 Hydroxy (Vit-D Deficiency, Fractures) ? ?Hyperlipidemia LDL goal <130- Statin therapy is not indicated. ?-     Lipid panel; Future ?-     TSH; Future ?-     Hepatic function panel; Future ?-     Hepatic function panel ?-     TSH ?-     Lipid panel ? ?Screen for colon cancer ?-     Cologuard ? ? ?I have discontinued Shelah Heatley. Edelson's cyanocobalamin. I am also having her maintain her (Flaxseed, Linseed, (FLAX SEED OIL PO)) and multivitamin. ? ?No orders of the defined types were placed in this encounter. ? ? ? ?Follow-up: Return in about 6 months (around 10/14/2022). ? ?Scarlette Calico, MD ? ?

## 2022-04-15 MED ORDER — BOOSTRIX 5-2.5-18.5 LF-MCG/0.5 IM SUSP: 0.5000 mL | Freq: Once | INTRAMUSCULAR | 0 refills | Status: AC

## 2022-04-15 MED ORDER — SHINGRIX 50 MCG/0.5ML IM SUSR: 0.5000 mL | Freq: Once | INTRAMUSCULAR | 1 refills | Status: AC

## 2022-04-19 ENCOUNTER — Telehealth: Payer: Self-pay

## 2022-04-19 NOTE — Telephone Encounter (Signed)
Prolia VOB initiated via [email or web address] ? ?New start ? ?

## 2022-04-24 NOTE — Telephone Encounter (Signed)
Prior auth required for PROLIA  PA PROCESS DETAILS: Precertification is required. Call [phone] or complete the Precertification form available at [email or web address] 

## 2022-05-05 DIAGNOSIS — Z1211 Encounter for screening for malignant neoplasm of colon: Secondary | ICD-10-CM | POA: Diagnosis not present

## 2022-05-12 LAB — COLOGUARD: COLOGUARD: NEGATIVE

## 2022-05-19 NOTE — Telephone Encounter (Signed)
Prior Authorization initiated for [facility] via [employer] Case ID: [id number]

## 2022-05-25 NOTE — Telephone Encounter (Signed)
Erin Lima, MD  Jari Pigg, CMA 2 months ago   She has pretty severe osteoporosis  I will order a referral to start treatment with forteo by rheum   Scarlette Calico, MD

## 2022-05-26 NOTE — Telephone Encounter (Signed)
Pt ready for scheduling on or after 05/19/22  Out-of-pocket cost due at time of visit: $301  Primary: [employer] Medicare Prolia co-insurance: 20% (approximately $276) Admin fee co-insurance: 20% (approximately $25)  Secondary: n/a Prolia co-insurance:  Admin fee co-insurance:   Deductible: does not apply  Prior Auth: APPROVED PA# [id number] Valid: 05/19/22-05/20/23  ** This summary of benefits is an estimation of the patient's out-of-pocket cost. Exact cost may vary based on individual plan coverage.

## 2022-05-26 NOTE — Telephone Encounter (Signed)
PA# [id number] Valid: 05/19/22-05/20/23

## 2022-06-13 ENCOUNTER — Telehealth: Payer: Self-pay | Admitting: Internal Medicine

## 2022-06-15 NOTE — Telephone Encounter (Signed)
Called pt, LVM.   

## 2022-08-12 NOTE — Progress Notes (Signed)
Office Visit Note  Patient: Erin Kim             Date of Birth: [birth year]           MRN: [id number]             PCP: Janith Lima, MD Referring: Janith Lima, MD Visit Date: 08/25/2022 Occupation: '@GUAROCC'$ @  Subjective:  Treatment of osteoporosis  History of Present Illness: Io Dieujuste is a 71 y.o. female seen in consultation per request of Dr. Ronnald Ramp.  According the patient she had recent DEXA scan on February 28, 2022 which showed osteoporosis.  She was given different treatment options for osteoporosis.  She is concerned about the side effects of most osteoporosis agents.  She states she smoked as a teenager for about 3 years and then stopped.  She has never been on levothyroxine, antiepileptics or H2 blockers.  There is no personal or family history of fracture.  Her mother did not have osteoporosis although her sister has osteoporosis is not taking any treatment.  Patient states she was offered anabolic agents by Dr. Ronnald Ramp but she was concerned about daily injections and did not want to use them.  He discussed different treatment options and applied to Prolia which has been approved.  She is hesitant to go on Prolia and wants to discuss the side effects.  She also has pain and stiffness in her hands.  She works at a [employer] and has difficulty gripping objects.  None of the other joints are painful or swollen.  She has been walking daily for exercise.  She is also using a rebounder and recently started lifting weights.  Activities of Daily Living:  Patient reports morning stiffness for 0 minutes.   Patient Denies nocturnal pain.  Difficulty dressing/grooming: Denies Difficulty climbing stairs: Denies Difficulty getting out of chair: Denies Difficulty using hands for taps, buttons, cutlery, and/or writing: Reports  Review of Systems  Constitutional:  Negative for fatigue, night sweats, weight gain and weight loss.  HENT:  Negative for mouth sores,  trouble swallowing, trouble swallowing, mouth dryness and nose dryness.   Eyes:  Positive for dryness. Negative for pain, redness and visual disturbance.  Respiratory:  Negative for cough, shortness of breath and difficulty breathing.   Cardiovascular:  Negative for chest pain, palpitations, hypertension, irregular heartbeat and swelling in legs/feet.  Gastrointestinal:  Negative for blood in stool, constipation and diarrhea.  Endocrine: Negative for increased urination.  Genitourinary:  Negative for involuntary urination and vaginal dryness.  Musculoskeletal:  Positive for joint pain and joint pain. Negative for gait problem, joint swelling, myalgias, muscle weakness, morning stiffness, muscle tenderness and myalgias.  Skin:  Positive for rash. Negative for color change, hair loss, skin tightness, ulcers and sensitivity to sunlight.  Allergic/Immunologic: Negative for susceptible to infections.  Neurological:  Negative for dizziness, headaches, memory loss, night sweats and weakness.  Hematological:  Negative for swollen glands.  Psychiatric/Behavioral:  Negative for depressed mood and sleep disturbance. The patient is not nervous/anxious.     PMFS History:  Patient Active Problem List   Diagnosis Date Noted   Age-related osteoporosis without current pathological fracture 03/18/2022   Estrogen deficiency 04/14/2021   Hyperlipidemia LDL goal <130 02/26/2019   Urticaria pigmentosa, adult form 02/26/2019   Breast cancer screening 02/26/2019   Hyperplastic colonic polyp 02/26/2019   B12 deficiency 02/26/2019   Vitamin D deficiency 02/26/2019   Routine general medical examination at a health care facility 02/27/2012  Visit for screening mammogram 02/27/2012    Past Medical History:  Diagnosis Date   Needle phobia    Squamous cell carcinoma of skin of right lower extremity 01/2016   Urticaria pigmentosa     Family History  Problem Relation Age of Onset   Diabetes Mother     Hypertension Father    Colon cancer Father    Stroke Father    Heart attack Father    Thyroid disease Sister    Cancer Maternal Grandmother    Stroke Paternal Grandmother    Asthma Son    Healthy Son    Healthy Son    Healthy Son    Alcohol abuse Neg Hx    Heart disease Neg Hx    Hyperlipidemia Neg Hx    Kidney disease Neg Hx    Early death Neg Hx    Stomach cancer Neg Hx    Rectal cancer Neg Hx    Esophageal cancer Neg Hx    Colon polyps Neg Hx    Past Surgical History:  Procedure Laterality Date   CESAREAN SECTION  1989   COLONOSCOPY  03/28/2012   Social History   Social History Narrative   Caffienated drinks-yes   Seat belt use often-yes   Regular Exercise-yes   Smoke alarm in the home-yes   Firearms/guns in the home-no   History of physical abuse-no               Immunization History  Administered Date(s) Administered   PFIZER(Purple Top)SARS-COV-2 Vaccination 08/12/2020, 09/02/2020   Pneumococcal Conjugate-13 02/26/2019   Pneumococcal Polysaccharide-23 04/07/2020   Tdap 02/27/2012     Objective: Vital Signs: BP 131/75 (BP Location: Right Arm, Patient Position: Sitting, Cuff Size: Normal)   Pulse 75   Resp 15   Ht 5' 2.5" (1.588 m)   Wt 115 lb 9.6 oz (52.4 kg)   LMP 12/19/2001 (Approximate)   BMI 20.81 kg/m    Physical Exam Vitals and nursing note reviewed.  Constitutional:      Appearance: She is well-developed.  HENT:     Head: Normocephalic and atraumatic.  Eyes:     Conjunctiva/sclera: Conjunctivae normal.  Cardiovascular:     Rate and Rhythm: Normal rate and regular rhythm.     Heart sounds: Normal heart sounds.  Pulmonary:     Effort: Pulmonary effort is normal.     Breath sounds: Normal breath sounds.  Abdominal:     General: Bowel sounds are normal.     Palpations: Abdomen is soft.  Musculoskeletal:     Cervical back: Normal range of motion.  Lymphadenopathy:     Cervical: No cervical adenopathy.  Skin:    General: Skin is  warm and dry.     Capillary Refill: Capillary refill takes less than 2 seconds.  Neurological:     Mental Status: She is alert and oriented to person, place, and time.  Psychiatric:        Behavior: Behavior normal.      Musculoskeletal Exam: C-spine thoracic and lumbar spine were in good range of motion.  Shoulder joints, elbow joints, wrist joints, MCPs PIPs and DIPs with good range of motion.  She had bilateral CMC PIP and DIP thickening.  Mild inflammation was noted in bilateral second PIP joints.  Hip joints and knee joints in good range of motion.  She had no tenderness over ankles or MTPs.  Callus formation was noted under her first toe.  CDAI Exam: CDAI Score: -- Patient Global: --;  Provider Global: -- Swollen: --; Tender: -- Joint Exam 08/25/2022   No joint exam has been documented for this visit   There is currently no information documented on the homunculus. Go to the Rheumatology activity and complete the homunculus joint exam.  Investigation: No additional findings.  Imaging: No results found.  Recent Labs: Lab Results  Component Value Date   WBC 10.3 04/14/2022   HGB 13.6 04/14/2022   PLT 284.0 04/14/2022   NA 135 04/14/2022   K 4.8 04/14/2022   CL 99 04/14/2022   CO2 30 04/14/2022   GLUCOSE 100 (H) 04/14/2022   BUN 13 04/14/2022   CREATININE 0.60 04/14/2022   BILITOT 0.7 04/14/2022   ALKPHOS 61 04/14/2022   AST 17 04/14/2022   ALT 14 04/14/2022   PROT 7.4 04/14/2022   ALBUMIN 4.4 04/14/2022   CALCIUM 9.3 04/14/2022    Speciality Comments: No specialty comments available.  Procedures:  No procedures performed Allergies: Patient has no known allergies.   Assessment / Plan:     Visit Diagnoses: Age-related osteoporosis without current pathological fracture - DEXA 02/28/22:The BMD measured at Forearm Radius 33% is 0.543 g/cm2 with a T-scoreof -3.8. -Detailed counsel regarding osteoporosis was provided.  Different treatment options and their side  effects were discussed at length.  We discussed bisphosphonates, Prolia and anabolic agents.  After reviewing indications side effects contraindications patient leaning towards Prolia.  Side effects of Prolia were discussed and a handout was given.  She had recent TSH which was normal.  CBC was normal.  I will obtain CMP, PTH and SPEP.  She does not need any dental work.  Good dental hygiene was discussed.  Frequent and regular visits with the dentist were discussed.  Use of calcium and vitamin D was discussed.  Plan: Parathyroid hormone, intact (no Ca), Serum protein electrophoresis with reflex  Vitamin D deficiency-she has history of vitamin D deficiency.  Her vitamin D was 30.26 on April 14, 2022.  I advised her to take vitamin D 2000 units daily.  Ideally she should keep vitamin D level between 40-50.  Pain in both hands -she complains of discomfort in her hands and also difficulty gripping objects.  She had severe osteoarthritis involving PIP and DIP joints with some inflammatory component in her bilateral first PIP joints.  She also had [place] prominence.  Prescription for [facility] braces was given.  She can use it she is doing activities.  Joint protection muscle strengthening was discussed.  A handout on hand exercises was given.  I also offered physical therapy but she would prefer to wait.  Natural anti-inflammatories were discussed.  Plan: XR Hand 2 View Right, XR Hand 2 View Left.  X-rays of bilateral hands were consistent with severe osteoarthritis involving CMC, PIP and DIP joints.  Medication monitoring encounter -in anticipation to start on Prolia I will obtain CMP with GFR today.  Plan: COMPLETE METABOLIC PANEL WITH GFR  G28 deficiency-B12 is low.  B12 supplement was advised.  Estrogen deficiency  Hyperlipidemia LDL goal <130  Urticaria pigmentosa, adult form - Followed by  Dr. Fredderick Phenix.  Orders: Orders Placed This Encounter  Procedures   XR Hand 2 View Right   XR Hand 2 View Left    Parathyroid hormone, intact (no Ca)   Serum protein electrophoresis with reflex   COMPLETE METABOLIC PANEL WITH GFR   No orders of the defined types were placed in this encounter.   Face-to-face time spent with patient was 45 minutes. Greater than 50%  of time was spent in counseling and coordination of care.  Follow-Up Instructions: Return for Osteoporosis, Osteoarthritis.   Bo Merino, MD  Note - This record has been created using Editor, commissioning.  Chart creation errors have been sought, but may not always  have been located. Such creation errors do not reflect on  the standard of medical care.

## 2022-08-25 ENCOUNTER — Ambulatory Visit (INDEPENDENT_AMBULATORY_CARE_PROVIDER_SITE_OTHER): Payer: Medicare HMO

## 2022-08-25 ENCOUNTER — Encounter: Payer: Self-pay | Admitting: Rheumatology

## 2022-08-25 ENCOUNTER — Ambulatory Visit: Payer: Medicare HMO | Attending: Rheumatology | Admitting: Rheumatology

## 2022-08-25 VITALS — BP 131/75 | HR 75 | Resp 15 | Ht 62.5 in | Wt 115.6 lb

## 2022-08-25 DIAGNOSIS — M79642 Pain in left hand: Secondary | ICD-10-CM

## 2022-08-25 DIAGNOSIS — D4701 Cutaneous mastocytosis: Secondary | ICD-10-CM

## 2022-08-25 DIAGNOSIS — M79641 Pain in right hand: Secondary | ICD-10-CM

## 2022-08-25 DIAGNOSIS — M810 Age-related osteoporosis without current pathological fracture: Secondary | ICD-10-CM | POA: Diagnosis not present

## 2022-08-25 DIAGNOSIS — E538 Deficiency of other specified B group vitamins: Secondary | ICD-10-CM | POA: Diagnosis not present

## 2022-08-25 DIAGNOSIS — Z86010 Personal history of colonic polyps: Secondary | ICD-10-CM

## 2022-08-25 DIAGNOSIS — E559 Vitamin D deficiency, unspecified: Secondary | ICD-10-CM

## 2022-08-25 DIAGNOSIS — Z5181 Encounter for therapeutic drug level monitoring: Secondary | ICD-10-CM | POA: Diagnosis not present

## 2022-08-25 DIAGNOSIS — E785 Hyperlipidemia, unspecified: Secondary | ICD-10-CM | POA: Diagnosis not present

## 2022-08-25 DIAGNOSIS — E2839 Other primary ovarian failure: Secondary | ICD-10-CM | POA: Diagnosis not present

## 2022-08-25 NOTE — Patient Instructions (Signed)
Osteoporosis  Osteoporosis happens when the bones become thin and less dense than normal. Osteoporosis makes bones more brittle and fragile and more likely to break (fracture). Over time, osteoporosis can cause your bones to become so weak that they fracture after a minor fall. Bones in the hip, wrist, and spine are most likely to fracture due to osteoporosis. What are the causes? The exact cause of this condition is not known. What increases the risk? You are more likely to develop this condition if you: Have family members with this condition. Have poor nutrition. Use the following: Steroid medicines, such as prednisone. Anti-seizure medicines. Nicotine or tobacco, such as cigarettes, e-cigarettes, and chewing tobacco. Are female. Are age 52 or older. Are not physically active (are sedentary). Are of European or Asian descent. Have a small body frame. What are the signs or symptoms? A fracture might be the first sign of osteoporosis, especially if the fracture results from a fall or injury that usually would not cause a bone to break. Other signs and symptoms include: Pain in the neck or low back. Stooped posture. Loss of height. How is this diagnosed? This condition may be diagnosed based on: Your medical history. A physical exam. A bone mineral density test, also called a DXA or DEXA test (dual-energy X-ray absorptiometry test). This test uses X-rays to measure the amount of minerals in your bones. How is this treated? This condition may be treated by: Making lifestyle changes, such as: Including foods with more calcium and vitamin D in your diet. Doing weight-bearing and muscle-strengthening exercises. Stopping tobacco use. Limiting alcohol intake. Taking medicine to slow the process of bone loss or to increase bone density. Taking daily supplements of calcium and vitamin D. Taking hormone replacement medicines, such as estrogen for women and testosterone for  men. Monitoring your levels of calcium and vitamin D. The goal of treatment is to strengthen your bones and lower your risk for a fracture. Follow these instructions at home: Eating and drinking Include calcium and vitamin D in your diet. Calcium is important for bone health, and vitamin D helps your body absorb calcium. Good sources of calcium and vitamin D include: Certain fatty fish, such as salmon and tuna. Products that have calcium and vitamin D added to them (are fortified), such as fortified cereals. Egg yolks. Cheese. Liver.  Activity Do exercises as told by your health care provider. Ask your health care provider what exercises and activities are safe for you. You should do: Exercises that make you work against gravity (weight-bearing exercises), such as tai chi, yoga, or walking. Exercises to strengthen muscles, such as lifting weights. Lifestyle Do not drink alcohol if: Your health care provider tells you not to drink. You are pregnant, may be pregnant, or are planning to become pregnant. If you drink alcohol: Limit how much you use to: 0-1 drink a day for women. 0-2 drinks a day for men. Know how much alcohol is in your drink. In the [place] one drink equals one 12 oz bottle of beer (355 mL), one 5 oz glass of wine (148 mL), or one 1 oz glass of hard liquor (44 mL). Do not use any products that contain nicotine or tobacco, such as cigarettes, e-cigarettes, and chewing tobacco. If you need help quitting, ask your health care provider. Preventing falls Use devices to help you move around (mobility aids) as needed, such as canes, walkers, scooters, or crutches. Keep rooms well-lit and clutter-free. Remove tripping hazards from walkways, including cords and  throw rugs. Install grab bars in bathrooms and safety rails on stairs. Use rubber mats in the bathroom and other areas that are often wet or slippery. Wear closed-toe shoes that fit well and support your feet. Wear shoes  that have rubber soles or low heels. Review your medicines with your health care provider. Some medicines can cause dizziness or changes in blood pressure, which can increase your risk of falling. General instructions Take over-the-counter and prescription medicines only as told by your health care provider. Keep all follow-up visits. This is important. Contact a health care provider if: You have never been screened for osteoporosis and you are: A woman who is age 43 or older. A man who is age 32 or older. Get help right away if: You fall or injure yourself. Summary Osteoporosis is thinning and loss of density in your bones. This makes bones more brittle and fragile and more likely to break (fracture),even with minor falls. The goal of treatment is to strengthen your bones and lower your risk for a fracture. Include calcium and vitamin D in your diet. Calcium is important for bone health, and vitamin D helps your body absorb calcium. Talk with your health care provider about screening for osteoporosis if you are a woman who is age 32 or older, or a man who is age 59 or older. This information is not intended to replace advice given to you by your health care provider. Make sure you discuss any questions you have with your health care provider. Document Revised: 05/21/2020 Document Reviewed: 05/21/2020 Elsevier Patient Education  [place].  Denosumab Injection (Osteoporosis) What is this medication? DENOSUMAB (den oh SUE mab) prevents and treats osteoporosis. It works by Paramedic stronger and less likely to break (fracture). It is a monoclonal antibody. This medicine may be used for other purposes; ask your health care provider or pharmacist if you have questions. COMMON BRAND NAME(S): Prolia What should I tell my care team before I take this medication? They need to know if you have any of these conditions: Dental or gum disease, or plan to have dental surgery or a tooth  pulled Infection Kidney disease Low levels of calcium or vitamin D in your blood On dialysis Poor nutrition Skin conditions Thyroid disease, or have had thyroid or parathyroid surgery Trouble absorbing minerals in your stomach or intestine An unusual reaction to denosumab, other medications, foods, dyes, or preservatives Pregnant or trying to get pregnant Breast-feeding How should I use this medication? This medication is injected under the skin. It is given by your care team in a hospital or clinic setting. A special MedGuide will be given to you before each treatment. Be sure to read this information carefully each time. Talk to your care team about the use of this medication in children. Special care may be needed. Overdosage: If you think you have taken too much of this medicine contact a poison control center or emergency room at once. NOTE: This medicine is only for you. Do not share this medicine with others. What if I miss a dose? Keep appointments for follow-up doses. It is important not to miss your dose. Call your care team if you are unable to keep an appointment. What may interact with this medication? Do not take this medication with any of the following: Other medications that contain denosumab This medication may also interact with the following: Medications that lower your chance of fighting infection Steroid medications, such as prednisone or cortisone This list  may not describe all possible interactions. Give your health care provider a list of all the medicines, herbs, non-prescription drugs, or dietary supplements you use. Also tell them if you smoke, drink alcohol, or use illegal drugs. Some items may interact with your medicine. What should I watch for while using this medication? Your condition will be monitored carefully while you are receiving this medication. You may need blood work while taking this medication. This medication may increase your risk of getting  an infection. Call your care team for advice if you get a fever, chills, sore throat, or other symptoms of a cold or flu. Do not treat yourself. Try to avoid being around people who are sick. Tell your dentist and dental surgeon that you are taking this medication. You should not have major dental surgery while on this medication. See your dentist to have a dental exam and fix any dental problems before starting this medication. Take good care of your teeth while on this medication. Make sure you see your dentist for regular follow-up appointments. You should make sure you get enough calcium and vitamin D while you are taking this medication. Discuss the foods you eat and the vitamins you take with your care team. Talk to your care team if you are pregnant or think you might be pregnant. This medication can cause serious birth defects if taken during pregnancy and for 5 months after the last dose. You will need a negative pregnancy test before starting this medication. Contraception is recommended while taking this medication and for 5 months after the last dose. Your care team can help you find the option that works for you. Talk to your care team before breastfeeding. Changes to your treatment plan may be needed. What side effects may I notice from receiving this medication? Side effects that you should report to your care team as soon as possible: Allergic reactions--skin rash, itching, hives, swelling of the face, lips, tongue, or throat Infection--fever, chills, cough, sore throat, wounds that don't heal, pain or trouble when passing urine, general feeling of discomfort or being unwell Low calcium level--muscle pain or cramps, confusion, tingling, or numbness in the hands or feet Osteonecrosis of the jaw--pain, swelling, or redness in the mouth, numbness of the jaw, poor healing after dental work, unusual discharge from the mouth, visible bones in the mouth Severe bone, joint, or muscle pain Skin  infection--skin redness, swelling, warmth, or pain Side effects that usually do not require medical attention (report these to your care team if they continue or are bothersome): Back pain Headache Joint pain Muscle pain Pain in the hands, arms, legs, or feet Runny or stuffy nose Sore throat This list may not describe all possible side effects. Call your doctor for medical advice about side effects. You may report side effects to FDA at 1-800-FDA-1088. Where should I keep my medication? This medication is given in a hospital or clinic. It will not be stored at home. NOTE: This sheet is a summary. It may not cover all possible information. If you have questions about this medicine, talk to your doctor, pharmacist, or health care provider.  2023 Elsevier/Gold Standard (2022-04-18 00:00:00) Hand Exercises Hand exercises can be helpful for almost anyone. These exercises can strengthen the hands, improve flexibility and movement, and increase blood flow to the hands. These results can make work and daily tasks easier. Hand exercises can be especially helpful for people who have joint pain from arthritis or have nerve damage from overuse (carpal tunnel  syndrome). These exercises can also help people who have injured a hand. Exercises Most of these hand exercises are gentle stretching and motion exercises. It is usually safe to do them often throughout the day. Warming up your hands before exercise may help to reduce stiffness. You can do this with gentle massage or by placing your hands in warm water for 10-15 minutes. It is normal to feel some stretching, pulling, tightness, or mild discomfort as you begin new exercises. This will gradually improve. Stop an exercise right away if you feel sudden, severe pain or your pain gets worse. Ask your health care provider which exercises are best for you. Knuckle bend or "claw" fist  Stand or sit with your arm, hand, and all five fingers pointed straight  up. Make sure to keep your wrist straight during the exercise. Gently bend your fingers down toward your palm until the tips of your fingers are touching the top of your palm. Keep your big knuckle straight and just bend the small knuckles in your fingers. Hold this position for __________ seconds. Straighten (extend) your fingers back to the starting position. Repeat this exercise 5-10 times with each hand. Full finger fist  Stand or sit with your arm, hand, and all five fingers pointed straight up. Make sure to keep your wrist straight during the exercise. Gently bend your fingers into your palm until the tips of your fingers are touching the middle of your palm. Hold this position for __________ seconds. Extend your fingers back to the starting position, stretching every joint fully. Repeat this exercise 5-10 times with each hand. Straight fist Stand or sit with your arm, hand, and all five fingers pointed straight up. Make sure to keep your wrist straight during the exercise. Gently bend your fingers at the big knuckle, where your fingers meet your hand, and the middle knuckle. Keep the knuckle at the tips of your fingers straight and try to touch the bottom of your palm. Hold this position for __________ seconds. Extend your fingers back to the starting position, stretching every joint fully. Repeat this exercise 5-10 times with each hand. Tabletop  Stand or sit with your arm, hand, and all five fingers pointed straight up. Make sure to keep your wrist straight during the exercise. Gently bend your fingers at the big knuckle, where your fingers meet your hand, as far down as you can while keeping the small knuckles in your fingers straight. Think of forming a tabletop with your fingers. Hold this position for __________ seconds. Extend your fingers back to the starting position, stretching every joint fully. Repeat this exercise 5-10 times with each hand. Finger spread  Place your  hand flat on a table with your palm facing down. Make sure your wrist stays straight as you do this exercise. Spread your fingers and thumb apart from each other as far as you can until you feel a gentle stretch. Hold this position for __________ seconds. Bring your fingers and thumb tight together again. Hold this position for __________ seconds. Repeat this exercise 5-10 times with each hand. Making circles  Stand or sit with your arm, hand, and all five fingers pointed straight up. Make sure to keep your wrist straight during the exercise. Make a circle by touching the tip of your thumb to the tip of your index finger. Hold for __________ seconds. Then open your hand wide. Repeat this motion with your thumb and each finger on your hand. Repeat this exercise 5-10 times with each  hand. Thumb motion  Sit with your forearm resting on a table and your wrist straight. Your thumb should be facing up toward the ceiling. Keep your fingers relaxed as you move your thumb. Lift your thumb up as high as you can toward the ceiling. Hold for __________ seconds. Bend your thumb across your palm as far as you can, reaching the tip of your thumb for the small finger (pinkie) side of your palm. Hold for __________ seconds. Repeat this exercise 5-10 times with each hand. Grip strengthening  Hold a stress ball or other soft ball in the middle of your hand. Slowly increase the pressure, squeezing the ball as much as you can without causing pain. Think of bringing the tips of your fingers into the middle of your palm. All of your finger joints should bend when doing this exercise. Hold your squeeze for __________ seconds, then relax. Repeat this exercise 5-10 times with each hand. Contact a health care provider if: Your hand pain or discomfort gets much worse when you do an exercise. Your hand pain or discomfort does not improve within 2 hours after you exercise. If you have any of these problems, stop doing  these exercises right away. Do not do them again unless your health care provider says that you can. Get help right away if: You develop sudden, severe hand pain or swelling. If this happens, stop doing these exercises right away. Do not do them again unless your health care provider says that you can. This information is not intended to replace advice given to you by your health care provider. Make sure you discuss any questions you have with your health care provider. Document Revised: 03/25/2021 Document Reviewed: 03/25/2021 Elsevier Patient Education  [place].

## 2022-08-30 ENCOUNTER — Telehealth: Payer: Self-pay | Admitting: Rheumatology

## 2022-08-30 LAB — COMPLETE METABOLIC PANEL WITH GFR
AG Ratio: 1.7 (calc) (ref 1.0–2.5)
ALT: 12 U/L (ref 6–29)
AST: 12 U/L (ref 10–35)
Albumin: 4.3 g/dL (ref 3.6–5.1)
Alkaline phosphatase (APISO): 59 U/L (ref 37–153)
BUN: 8 mg/dL (ref 7–25)
CO2: 33 mmol/L — ABNORMAL HIGH (ref 20–32)
Calcium: 9.2 mg/dL (ref 8.6–10.4)
Chloride: 101 mmol/L (ref 98–110)
Creat: 0.61 mg/dL (ref 0.50–1.05)
Globulin: 2.5 g/dL (calc) (ref 1.9–3.7)
Glucose, Bld: 87 mg/dL (ref 65–99)
Potassium: 4.2 mmol/L (ref 3.5–5.3)
Sodium: 138 mmol/L (ref 135–146)
Total Bilirubin: 0.5 mg/dL (ref 0.2–1.2)
Total Protein: 6.8 g/dL (ref 6.1–8.1)
eGFR: 97 mL/min/{1.73_m2} (ref >=60)

## 2022-08-30 LAB — PROTEIN ELECTROPHORESIS, SERUM, WITH REFLEX
Albumin ELP: 4.4 g/dL (ref 3.8–4.8)
Alpha 1: 0.2 g/dL (ref 0.2–0.3)
Alpha 2: 0.6 g/dL (ref 0.5–0.9)
Beta 2: 0.4 g/dL (ref 0.2–0.5)
Beta Globulin: 0.4 g/dL (ref 0.4–0.6)
Gamma Globulin: 1.1 g/dL (ref 0.8–1.7)
Total Protein: 7.2 g/dL (ref 6.1–8.1)

## 2022-08-30 LAB — PARATHYROID HORMONE, INTACT (NO CA): PTH: 25 pg/mL (ref 16–77)

## 2022-08-30 NOTE — Progress Notes (Signed)
CMP, PTH and SPEP are normal.  We discussed Prolia at the last visit.  I think patient wanted to start Prolia through her PCP.

## 2022-08-30 NOTE — Telephone Encounter (Signed)
Patient called the office stating she had missed a call from [place]. Patient requests a call back after 10am tomorrow 08/31/22.

## 2022-08-31 NOTE — Telephone Encounter (Signed)
If her PCP will be prescribing Prolia then she may cancel her follow-up appointment

## 2022-08-31 NOTE — Telephone Encounter (Signed)
Spoke with patient and advised patient she may cancel her appointment if her PCP is going to prescribe Prolia. Patient expressed understanding and has cancelled appointment.

## 2022-08-31 NOTE — Telephone Encounter (Signed)
Contacted patient and discussed lab results. Patient would like to know if she should keep her follow up appointment or can she cancel it since she will be discussing it with her dentist and PCP.

## 2022-09-21 ENCOUNTER — Telehealth: Payer: Self-pay

## 2022-09-21 NOTE — Telephone Encounter (Signed)
LVM to schedule AWV or follow up.

## 2022-09-27 ENCOUNTER — Ambulatory Visit: Payer: Medicare HMO | Admitting: Rheumatology

## 2022-10-04 ENCOUNTER — Telehealth: Payer: Self-pay | Admitting: Internal Medicine

## 2022-10-04 NOTE — Telephone Encounter (Signed)
Left message for patient to call back and schedule Medicare Annual Wellness Visit (AWV).   Please offer to do virtually or by telephone.  Left office number and my jabber [phone].  Last AWV:02/26/2019  Please schedule at anytime with Nurse Health Advisor.

## 2022-10-18 NOTE — Telephone Encounter (Signed)
Called patient again 10/18/22 to see if patient would like to start prolia. Left voicemail

## 2022-10-25 IMAGING — MG MM DIGITAL SCREENING BILAT W/ TOMO AND CAD
8 series · 9 of 24 positions shown · non-contrast
Comparison: Previous exam(s).

CLINICAL DATA: Screening.

EXAM:
DIGITAL SCREENING BILATERAL MAMMOGRAM WITH TOMOSYNTHESIS AND CAD
TECHNIQUE: Bilateral screening digital craniocaudal and mediolateral oblique
mammograms were obtained. Bilateral screening digital breast
tomosynthesis was performed. The images were evaluated with
computer-aided detection.

[R CC synth-2D]
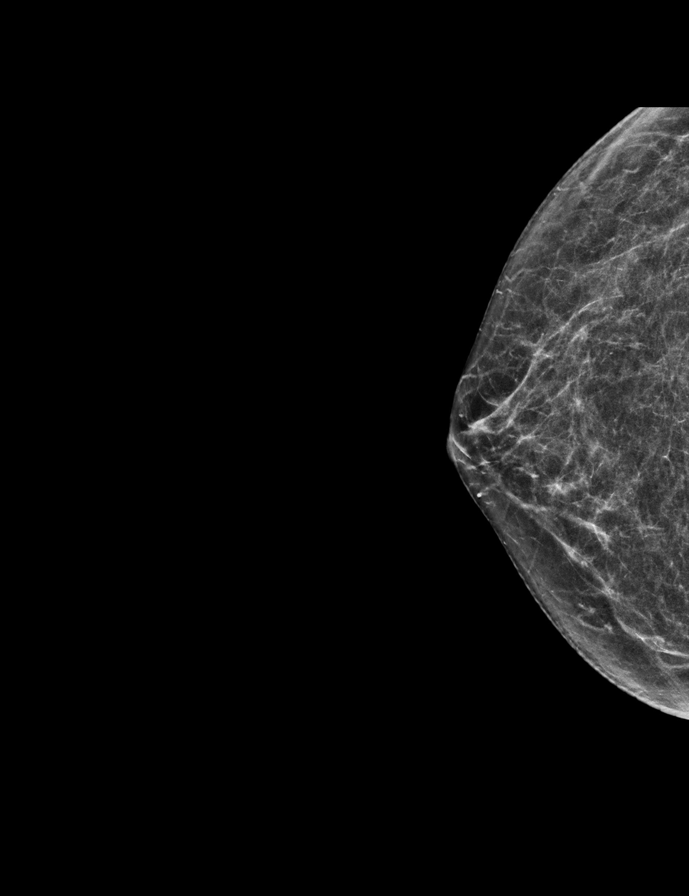

[R MLO synth-2D]
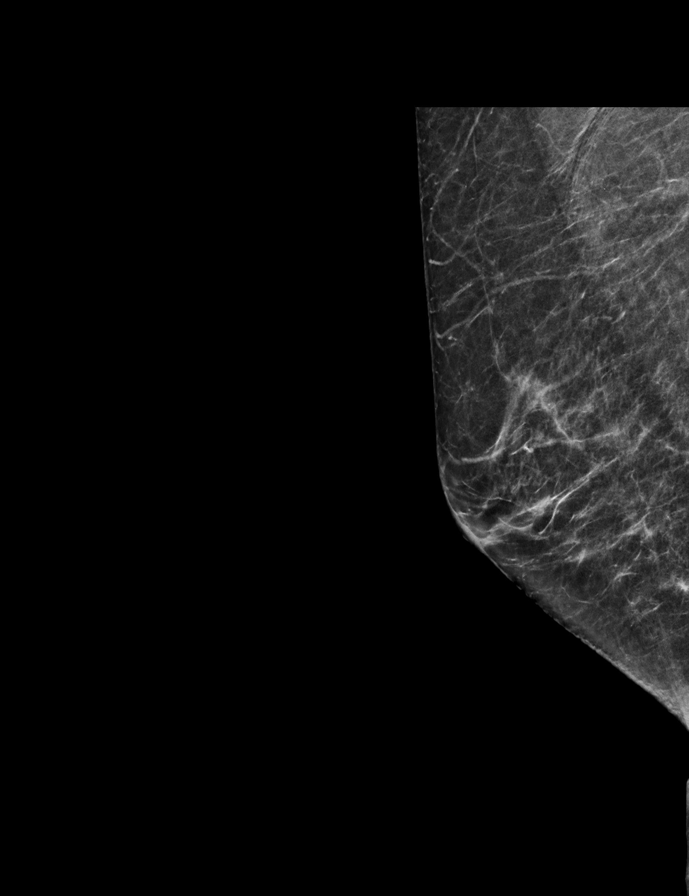

[L MLO synth-2D]
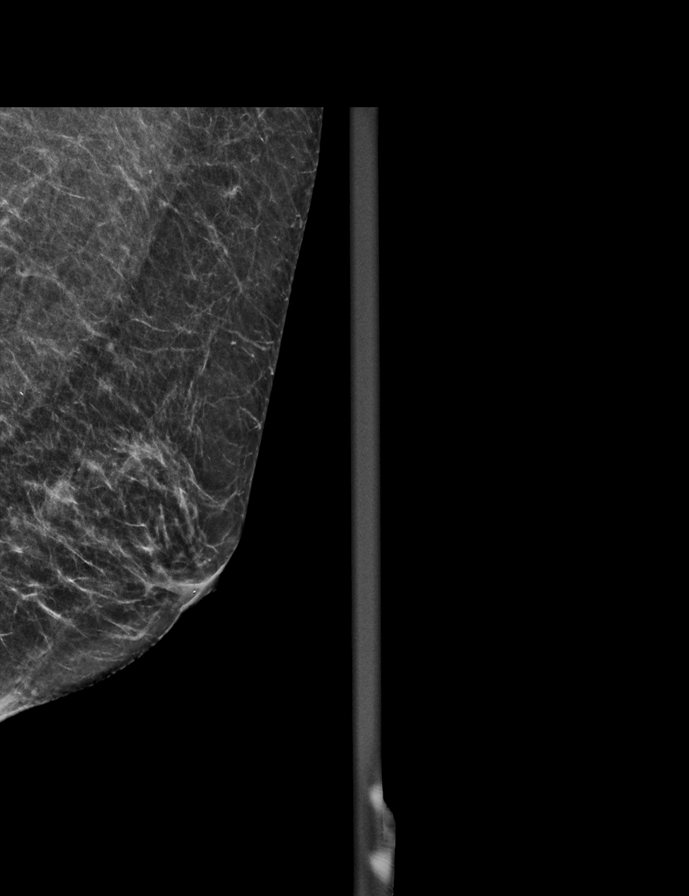

[L CC synth-2D]
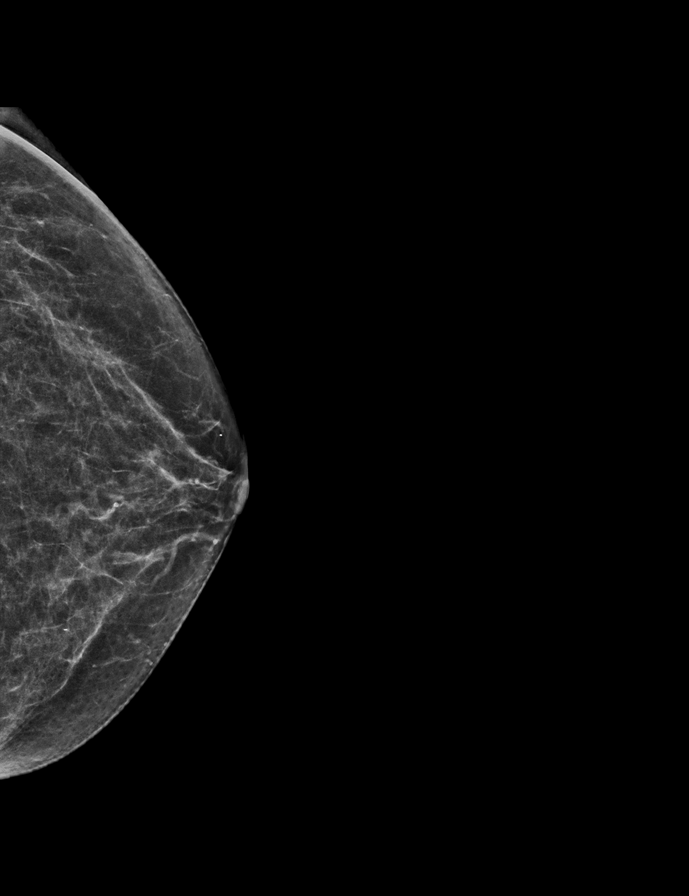

[R CC tomo · 2 of 53 frames shown]
[frame 18/53]
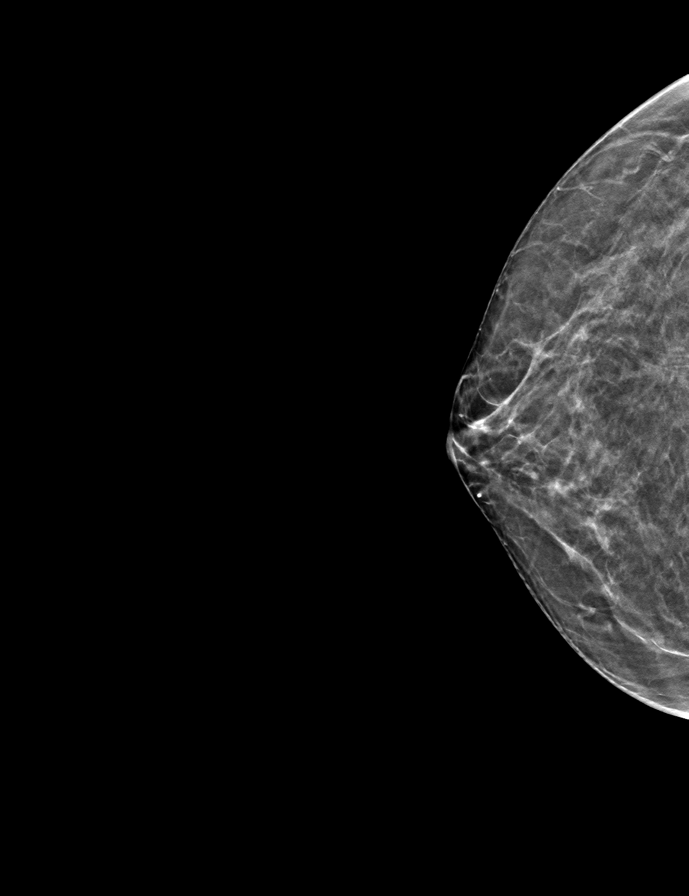
[frame 27/53]
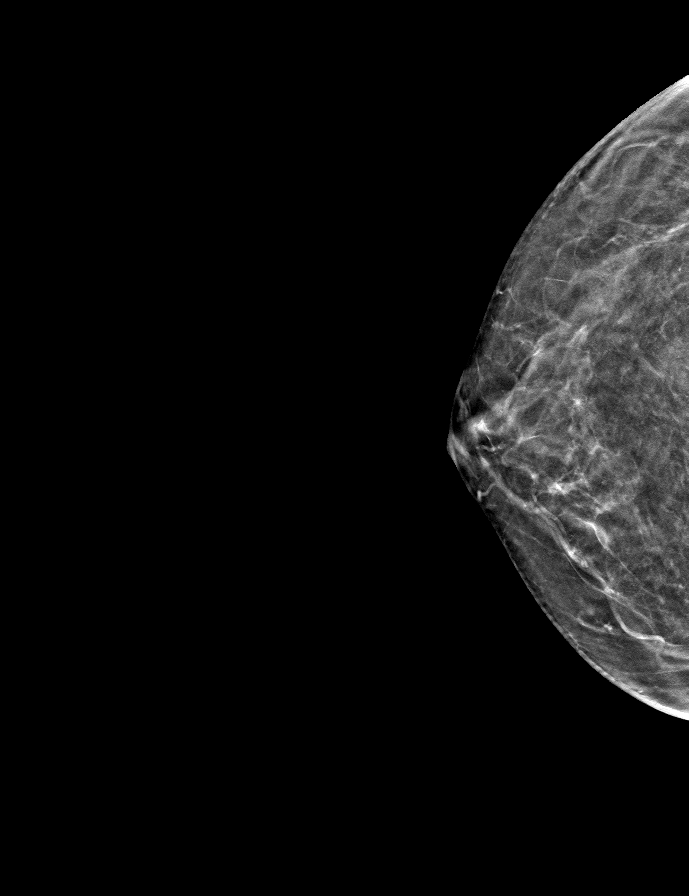

[R MLO tomo · tomo slice 27/52.0]
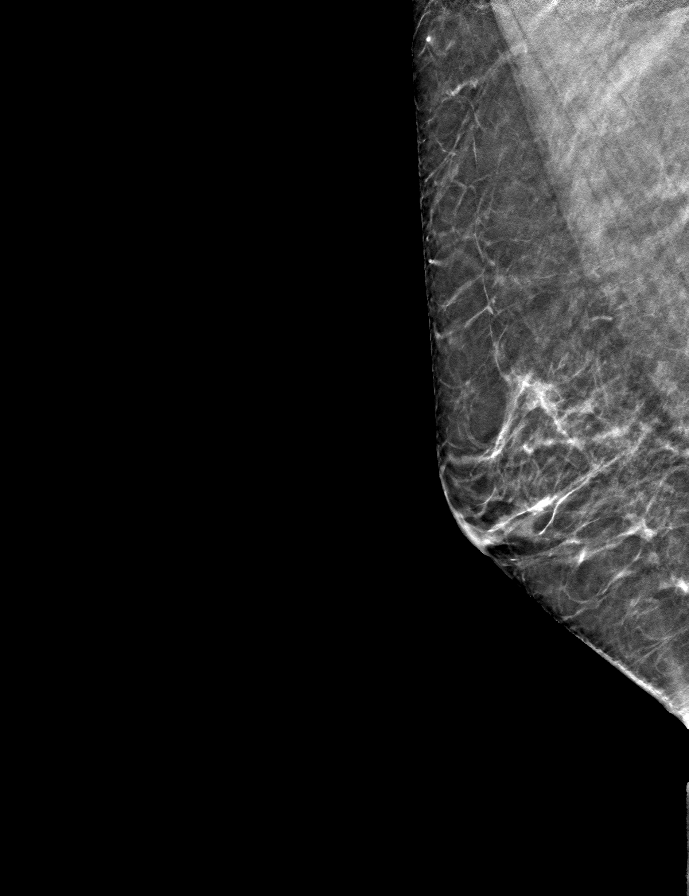

[L CC tomo · tomo slice 29/56.0]
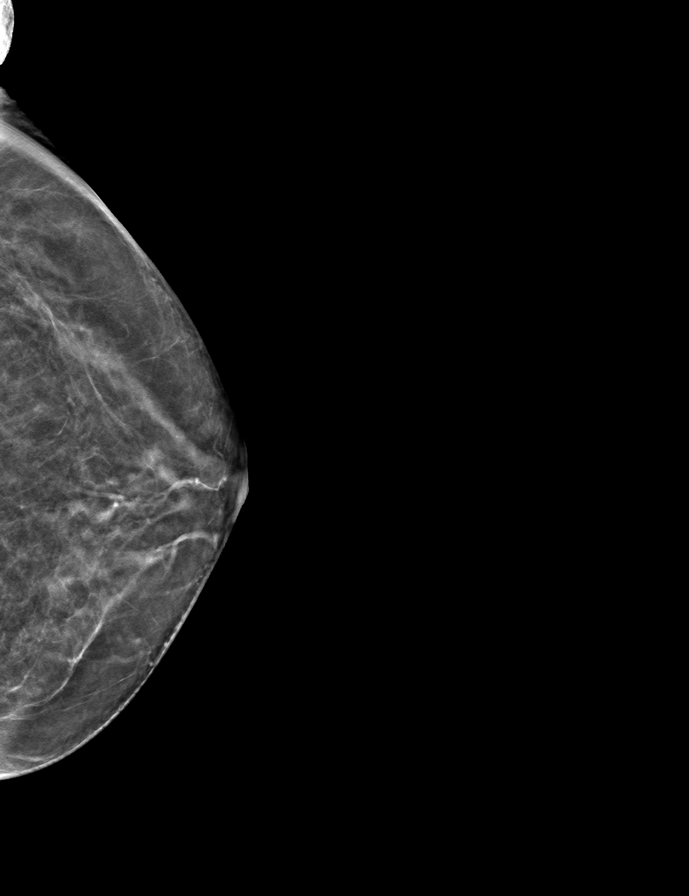

[L MLO tomo · tomo slice 25/50.0]
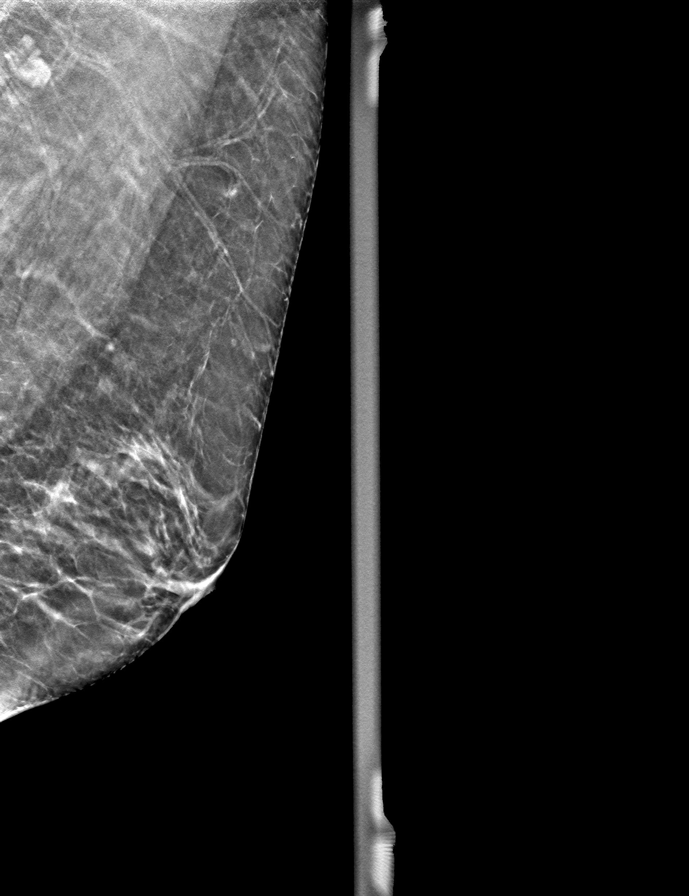

[9 of 24 positions shown; findings below may reference images not displayed]

ACR Breast Density Category b: There are scattered areas of
fibroglandular density.
FINDINGS: There are no findings suspicious for malignancy.
IMPRESSION: No mammographic evidence of malignancy. A result letter of this
screening mammogram will be mailed directly to the patient.

RECOMMENDATION:
Screening mammogram in one year. (Code:51-O-LD2)

BI-RADS CATEGORY  1: Negative.

## 2022-12-01 NOTE — Telephone Encounter (Signed)
Forwarding to RX Prior Auth Team 

## 2022-12-01 NOTE — Telephone Encounter (Signed)
Prolia VOB initiated via [email or web address] 

## 2022-12-02 ENCOUNTER — Other Ambulatory Visit (HOSPITAL_COMMUNITY): Payer: Self-pay

## 2022-12-02 NOTE — Telephone Encounter (Signed)
Pt ready for scheduling   Out-of-pocket cost due at time of visit: $301  Primary: [employer] Prolia co-insurance: 20% (approximately $276) Admin fee co-insurance: 20% (approximately $25)  Secondary:  Prolia co-insurance:  Admin fee co-insurance:   Deductible: N/A  Prior Auth: not needed PA# Valid:   Pharmacy benefit is cheaper for the patient. Send rx to [facility].Copay came back at $100.00.  ** This summary of benefits is an estimation of the patient's out-of-pocket cost. Exact cost may vary based on individual plan coverage.

## 2023-02-12 ENCOUNTER — Other Ambulatory Visit: Payer: Self-pay | Admitting: Internal Medicine

## 2023-02-12 DIAGNOSIS — M810 Age-related osteoporosis without current pathological fracture: Secondary | ICD-10-CM

## 2023-02-12 MED ORDER — DENOSUMAB 60 MG/ML ~~LOC~~ SOSY
60.0000 mg | PREFILLED_SYRINGE | Freq: Once | SUBCUTANEOUS | 1 refills | Status: AC
  Filled 2023-02-12: qty 1, 1d supply, fill #0

## 2023-02-13 ENCOUNTER — Other Ambulatory Visit (HOSPITAL_COMMUNITY): Payer: Self-pay

## 2023-02-13 ENCOUNTER — Other Ambulatory Visit: Payer: Self-pay

## 2023-04-06 DIAGNOSIS — H43813 Vitreous degeneration, bilateral: Secondary | ICD-10-CM | POA: Diagnosis not present

## 2023-04-06 DIAGNOSIS — H16223 Keratoconjunctivitis sicca, not specified as Sjogren's, bilateral: Secondary | ICD-10-CM | POA: Diagnosis not present

## 2023-04-06 DIAGNOSIS — H1045 Other chronic allergic conjunctivitis: Secondary | ICD-10-CM | POA: Diagnosis not present

## 2023-04-06 DIAGNOSIS — H2513 Age-related nuclear cataract, bilateral: Secondary | ICD-10-CM | POA: Diagnosis not present

## 2023-04-18 DIAGNOSIS — Z0100 Encounter for examination of eyes and vision without abnormal findings: Secondary | ICD-10-CM | POA: Diagnosis not present

## 2023-08-24 DIAGNOSIS — H00025 Hordeolum internum left lower eyelid: Secondary | ICD-10-CM | POA: Diagnosis not present

## 2024-02-01 ENCOUNTER — Telehealth: Payer: Self-pay

## 2024-02-01 NOTE — Telephone Encounter (Signed)
Called and spoke with patient. Patient noted she had not received the previously ordered prolia injection due to adverse effects and recommendation from her dentist.   Patient informed me she has been taking a holistic approach to her osteoporosis and would like to follow up with Dr.Jones before deciding on treating this with medicine.  Patient would like to have bone density scan scheduled post visit with Dr.Jones to see progress since last having one done.

## 2024-02-12 DIAGNOSIS — D4701 Cutaneous mastocytosis: Secondary | ICD-10-CM | POA: Diagnosis not present

## 2024-02-12 DIAGNOSIS — H1031 Unspecified acute conjunctivitis, right eye: Secondary | ICD-10-CM | POA: Diagnosis not present

## 2024-02-12 DIAGNOSIS — Z87892 Personal history of anaphylaxis: Secondary | ICD-10-CM | POA: Diagnosis not present

## 2024-03-07 ENCOUNTER — Ambulatory Visit (INDEPENDENT_AMBULATORY_CARE_PROVIDER_SITE_OTHER): Payer: Medicare HMO | Admitting: Internal Medicine

## 2024-03-07 ENCOUNTER — Encounter: Payer: Self-pay | Admitting: Internal Medicine

## 2024-03-07 VITALS — BP 136/74 | HR 84 | Temp 97.7°F | Resp 16 | Ht 62.5 in | Wt 114.0 lb

## 2024-03-07 DIAGNOSIS — M810 Age-related osteoporosis without current pathological fracture: Secondary | ICD-10-CM | POA: Diagnosis not present

## 2024-03-07 DIAGNOSIS — Z1231 Encounter for screening mammogram for malignant neoplasm of breast: Secondary | ICD-10-CM

## 2024-03-07 DIAGNOSIS — E785 Hyperlipidemia, unspecified: Secondary | ICD-10-CM | POA: Diagnosis not present

## 2024-03-07 DIAGNOSIS — Z1211 Encounter for screening for malignant neoplasm of colon: Secondary | ICD-10-CM | POA: Insufficient documentation

## 2024-03-07 DIAGNOSIS — Z0001 Encounter for general adult medical examination with abnormal findings: Secondary | ICD-10-CM

## 2024-03-07 DIAGNOSIS — E559 Vitamin D deficiency, unspecified: Secondary | ICD-10-CM

## 2024-03-07 DIAGNOSIS — Z23 Encounter for immunization: Secondary | ICD-10-CM

## 2024-03-07 DIAGNOSIS — Z0000 Encounter for general adult medical examination without abnormal findings: Secondary | ICD-10-CM | POA: Diagnosis not present

## 2024-03-07 DIAGNOSIS — I10 Essential (primary) hypertension: Secondary | ICD-10-CM | POA: Diagnosis not present

## 2024-03-07 DIAGNOSIS — E538 Deficiency of other specified B group vitamins: Secondary | ICD-10-CM

## 2024-03-07 DIAGNOSIS — E2839 Other primary ovarian failure: Secondary | ICD-10-CM

## 2024-03-07 LAB — BASIC METABOLIC PANEL
BUN: 10 mg/dL (ref 6–23)
CO2: 27 meq/L (ref 19–32)
Calcium: 9.2 mg/dL (ref 8.4–10.5)
Chloride: 100 meq/L (ref 96–112)
Creatinine, Ser: 0.62 mg/dL (ref 0.40–1.20)
GFR: 90.21 mL/min (ref >60.00)
Glucose, Bld: 94 mg/dL (ref 70–99)
Potassium: 4.7 meq/L (ref 3.5–5.1)
Sodium: 136 meq/L (ref 135–145)

## 2024-03-07 LAB — HEPATIC FUNCTION PANEL
ALT: 9 U/L (ref 0–35)
AST: 13 U/L (ref 0–37)
Albumin: 4.5 g/dL (ref 3.5–5.2)
Alkaline Phosphatase: 64 U/L (ref 39–117)
Bilirubin, Direct: 0.2 mg/dL (ref 0.0–0.3)
Total Bilirubin: 0.6 mg/dL (ref 0.2–1.2)
Total Protein: 7.2 g/dL (ref 6.0–8.3)

## 2024-03-07 LAB — URINALYSIS, ROUTINE W REFLEX MICROSCOPIC
Bilirubin Urine: NEGATIVE
Hgb urine dipstick: NEGATIVE
Ketones, ur: NEGATIVE
Leukocytes,Ua: NEGATIVE
Nitrite: NEGATIVE
Specific Gravity, Urine: 1.01 (ref 1.000–1.030)
Total Protein, Urine: NEGATIVE
Urine Glucose: NEGATIVE
Urobilinogen, UA: 0.2 (ref 0.0–1.0)
pH: 7 (ref 5.0–8.0)

## 2024-03-07 LAB — LIPID PANEL
Cholesterol: 220 mg/dL — ABNORMAL HIGH (ref 0–200)
HDL: 103.9 mg/dL (ref >39.00)
LDL Cholesterol: 104 mg/dL — ABNORMAL HIGH (ref 0–99)
NonHDL: 116.44
Total CHOL/HDL Ratio: 2
Triglycerides: 61 mg/dL (ref 0.0–149.0)
VLDL: 12.2 mg/dL (ref 0.0–40.0)

## 2024-03-07 LAB — VITAMIN D 25 HYDROXY (VIT D DEFICIENCY, FRACTURES): VITD: 38.23 ng/mL (ref 30.00–100.00)

## 2024-03-07 LAB — CBC WITH DIFFERENTIAL/PLATELET
Basophils Absolute: 0.1 10*3/uL (ref 0.0–0.1)
Basophils Relative: 1 % (ref 0.0–3.0)
Eosinophils Absolute: 0.1 10*3/uL (ref 0.0–0.7)
Eosinophils Relative: 1.2 % (ref 0.0–5.0)
HCT: 40.6 % (ref 36.0–46.0)
Hemoglobin: 13.5 g/dL (ref 12.0–15.0)
Lymphocytes Relative: 19.2 % (ref 12.0–46.0)
Lymphs Abs: 1.5 10*3/uL (ref 0.7–4.0)
MCHC: 33.3 g/dL (ref 30.0–36.0)
MCV: 91.5 fl (ref 78.0–100.0)
Monocytes Absolute: 0.5 10*3/uL (ref 0.1–1.0)
Monocytes Relative: 6.7 % (ref 3.0–12.0)
Neutro Abs: 5.5 10*3/uL (ref 1.4–7.7)
Neutrophils Relative %: 71.9 % (ref 43.0–77.0)
Platelets: 272 10*3/uL (ref 150.0–400.0)
RBC: 4.44 Mil/uL (ref 3.87–5.11)
RDW: 14.4 % (ref 11.5–15.5)
WBC: 7.7 10*3/uL (ref 4.0–10.5)

## 2024-03-07 LAB — TSH: TSH: 1.56 u[IU]/mL (ref 0.35–5.50)

## 2024-03-07 LAB — PHOSPHORUS: Phosphorus: 3.9 mg/dL (ref 2.3–4.6)

## 2024-03-07 LAB — FOLATE: Folate: 16.5 ng/mL (ref >5.9)

## 2024-03-07 LAB — VITAMIN B12: Vitamin B-12: 522 pg/mL (ref 211–911)

## 2024-03-07 MED ORDER — BOOSTRIX 5-2.5-18.5 LF-MCG/0.5 IM SUSP: 0.5000 mL | Freq: Once | INTRAMUSCULAR | 0 refills | Status: AC

## 2024-03-07 NOTE — Patient Instructions (Signed)

## 2024-03-07 NOTE — Progress Notes (Unsigned)
 Subjective:  Patient ID: Erin Kim, female    DOB: December 12, 1951  Age: 73 y.o. MRN: [id number]  CC: Annual Exam, Hypertension, and Hyperlipidemia   HPI Erin Kim presents for a CPX and f/up ---   Outpatient Medications Prior to Visit  Medication Sig Dispense Refill   Calcium-Magnesium-Vitamin D (CALCIUM MAGNESIUM PO) Take by mouth daily.     Cholecalciferol (VITAMIN D3 PO) Take by mouth daily.     Flaxseed, Linseed, (FLAX SEED OIL PO) Take 1 tablespoon in juice daily.     levocetirizine (XYZAL) 5 MG tablet Take 5 mg by mouth every evening.     Multiple Vitamin (MULTIVITAMIN) tablet Take 1 tablet by mouth daily.     No facility-administered medications prior to visit.    ROS Review of Systems  Objective:  BP 136/74 (BP Location: Left Arm, Patient Position: Sitting, Cuff Size: Small)   Pulse 84   Temp 97.7 F (36.5 C) (Temporal)   Resp 16   Ht 5' 2.5" (1.588 m)   Wt 114 lb (51.7 kg)   LMP 12/19/2001 (Approximate)   SpO2 98%   BMI 20.52 kg/m   BP Readings from Last 3 Encounters:  03/07/24 136/74  08/25/22 131/75  04/14/22 126/76    Wt Readings from Last 3 Encounters:  03/07/24 114 lb (51.7 kg)  08/25/22 115 lb 9.6 oz (52.4 kg)  04/14/22 116 lb (52.6 kg)    Physical Exam Vitals reviewed.  Constitutional:      Appearance: Normal appearance.  HENT:     Nose: Nose normal.     Mouth/Throat:     Mouth: Mucous membranes are moist.  Eyes:     General: No scleral icterus.    Conjunctiva/sclera: Conjunctivae normal.  Cardiovascular:     Rate and Rhythm: Normal rate and regular rhythm.     Heart sounds: No murmur heard.    No friction rub. No gallop.     Comments: EKG-- NSR, 81 bpm No LVH, Q waves, or ST/T wave changes  Pulmonary:     Effort: Pulmonary effort is normal.     Breath sounds: No stridor. No wheezing, rhonchi or rales.  Abdominal:     General: Abdomen is flat.     Palpations: There is no mass.     Tenderness: There is no  abdominal tenderness. There is no guarding.     Hernia: No hernia is present.  Musculoskeletal:     Cervical back: Neck supple.     Right lower leg: No edema.     Left lower leg: No edema.  Skin:    General: Skin is warm and dry.     Findings: No rash.  Neurological:     General: No focal deficit present.     Mental Status: She is alert. Mental status is at baseline.  Psychiatric:        Mood and Affect: Mood normal.        Behavior: Behavior normal.     Lab Results  Component Value Date   WBC 7.7 03/07/2024   HGB 13.5 03/07/2024   HCT 40.6 03/07/2024   PLT 272.0 03/07/2024   GLUCOSE 94 03/07/2024   CHOL 220 (H) 03/07/2024   TRIG 61.0 03/07/2024   HDL 103.90 03/07/2024   LDLDIRECT 95.6 02/27/2012   LDLCALC 104 (H) 03/07/2024   ALT 9 03/07/2024   AST 13 03/07/2024   NA 136 03/07/2024   K 4.7 03/07/2024   CL 100 03/07/2024   CREATININE 0.62  03/07/2024   BUN 10 03/07/2024   CO2 27 03/07/2024   TSH 1.56 03/07/2024    DG BONE DENSITY (DXA) Result Date: 02/28/2022 EXAM: DUAL X-RAY ABSORPTIOMETRY (DXA) FOR BONE MINERAL DENSITY IMPRESSION: Referring Physician:  Etta Grandchild Your patient completed a bone mineral density test using GE Lunar iDXA system (analysis version: 16). Technologist: SEC PATIENT: Name: Erin Kim Patient ID: [id number] Birth Date: Sep 06, 1951 Height: 62.5 in. Sex: Female Measured: 02/28/2022 Weight: 114.0 lbs. Indications: Caucasian, Estrogen Deficient, Postmenopausal Fractures: NONE Treatments: None ASSESSMENT: The BMD measured at Forearm Radius 33% is 0.543 g/cm2 with a T-score of -3.8. This patient is considered osteoporotic according to World Health Organization [facility]) criteria. The quality of the exam is good. Site Region Measured Date Measured Age YA BMD Significant CHANGE T-score Left Forearm Radius 33% 02/28/2022 68.4 -3.8 0.543 g/cm2 AP Spine L1-L4 02/28/2022 68.4 -3.4 0.776 g/cm2 DualFemur Neck Left 02/28/2022 68.4 -2.2 0.736 g/cm2 DualFemur  Total Mean 02/28/2022 68.4 -1.4 0.834 g/cm2 World Health Organization [facility]) criteria for post-menopausal, Caucasian Women: Normal       T-score at or above -1 SD Osteopenia   T-score between -1 and -2.5 SD Osteoporosis T-score at or below -2.5 SD RECOMMENDATION: 1. All patients should optimize calcium and vitamin D intake. 2. Consider FDA-approved medical therapies in postmenopausal women and men aged 16 years and older, based on the following: a. A hip or vertebral (clinical or morphometric) fracture. b. T-score = -2.5 at the femoral neck or spine after appropriate evaluation to exclude secondary causes. c. Low bone mass (T-score between -1.0 and -2.5 at the femoral neck or spine) and a 10-year probability of a hip fracture = 3% or a 10-year probability of a major osteoporosis-related fracture = 20% based on the [place]-adapted WHO algorithm. d. Clinician judgment and/or patient preferences may indicate treatment for people with 10-year fracture probabilities above or below these levels. FOLLOW-UP: Patients with diagnosis of osteoporosis or at high risk for fracture should have regular bone mineral density tests.? Patients eligible for Medicare are allowed routine testing every 2 years.? The testing frequency can be increased to one year for patients who have rapidly progressing disease, are receiving or discontinuing medical therapy to restore bone mass, or have additional risk factors. I have reviewed this study and agree with the findings. [facility] Electronically Signed   By: Ernie Avena M.D.   On: 02/28/2022 14:31    Assessment & Plan:  B12 deficiency -     Folate; Future -     CBC with Differential/Platelet; Future -     Vitamin B12; Future  Vitamin D deficiency -     VITAMIN D 25 Hydroxy (Vit-D Deficiency, Fractures); Future -     DG Bone Density; Future  Age-related osteoporosis without current pathological fracture -     Phosphorus; Future -     Basic metabolic panel;  Future -     VITAMIN D 25 Hydroxy (Vit-D Deficiency, Fractures); Future -     DG Bone Density; Future  Visit for screening mammogram -     Digital Screening Mammogram, Left and Right; Future  Hyperlipidemia LDL goal <130 -     Lipid panel; Future -     TSH; Future -     Hepatic function panel; Future  Estrogen deficiency -     DG Bone Density; Future  Primary hypertension -     TSH; Future -     Hepatic function panel; Future -  Basic metabolic panel; Future -     Urinalysis, Routine w reflex microscopic; Future -     EKG 12-Lead  Screening for colon cancer -     Cologuard  Need for prophylactic vaccination with combined diphtheria-tetanus-pertussis (DTP) vaccine -     Boostrix; Inject 0.5 mLs into the muscle once for 1 dose.  Dispense: 0.5 mL; Refill: 0     Follow-up: Return in about 6 months (around 09/07/2024).  Sanda Linger, MD

## 2024-03-08 DIAGNOSIS — Z23 Encounter for immunization: Secondary | ICD-10-CM | POA: Insufficient documentation

## 2024-03-08 DIAGNOSIS — Z0001 Encounter for general adult medical examination with abnormal findings: Secondary | ICD-10-CM | POA: Insufficient documentation

## 2024-03-08 NOTE — Assessment & Plan Note (Signed)
Exam completed, labs reviewed, vaccines reviewed and updated, cancer screenings addressed, pt ed material was given.

## 2024-03-18 ENCOUNTER — Other Ambulatory Visit: Payer: Self-pay | Admitting: Internal Medicine

## 2024-03-18 DIAGNOSIS — E559 Vitamin D deficiency, unspecified: Secondary | ICD-10-CM

## 2024-03-18 DIAGNOSIS — E2839 Other primary ovarian failure: Secondary | ICD-10-CM

## 2024-03-18 DIAGNOSIS — M810 Age-related osteoporosis without current pathological fracture: Secondary | ICD-10-CM

## 2024-03-21 ENCOUNTER — Ambulatory Visit
Admission: RE | Admit: 2024-03-21 | Discharge: 2024-03-21 | Disposition: A | Discharge status: Home / Self Care | Source: Ambulatory Visit | Attending: Internal Medicine | Admitting: Internal Medicine

## 2024-03-21 DIAGNOSIS — Z1231 Encounter for screening mammogram for malignant neoplasm of breast: Secondary | ICD-10-CM | POA: Diagnosis not present

## 2024-09-23 ENCOUNTER — Other Ambulatory Visit: Payer: Self-pay | Admitting: Internal Medicine

## 2024-09-23 DIAGNOSIS — Z1211 Encounter for screening for malignant neoplasm of colon: Secondary | ICD-10-CM

## 2024-09-26 DIAGNOSIS — K121 Other forms of stomatitis: Secondary | ICD-10-CM | POA: Diagnosis not present

## 2024-09-26 DIAGNOSIS — K1370 Unspecified lesions of oral mucosa: Secondary | ICD-10-CM | POA: Diagnosis not present

## 2024-09-26 DIAGNOSIS — L438 Other lichen planus: Secondary | ICD-10-CM | POA: Diagnosis not present

## 2024-11-13 ENCOUNTER — Ambulatory Visit (INDEPENDENT_AMBULATORY_CARE_PROVIDER_SITE_OTHER)

## 2024-11-13 VITALS — Ht 62.0 in | Wt 110.0 lb

## 2024-11-13 DIAGNOSIS — Z0000 Encounter for general adult medical examination without abnormal findings: Secondary | ICD-10-CM

## 2024-11-13 NOTE — Progress Notes (Signed)
 Chief Complaint  Patient presents with   Medicare Wellness     Subjective:   Erin Kim is a 73 y.o. female who presents for a Medicare Annual Wellness Visit.  I connected with  Erin Kim on 11/13/24 by a audio enabled telemedicine application and verified that I am speaking with the correct person using two identifiers.  Patient Location: Home  Provider Location: Office/Clinic  Persons Participating in Visit: Patient.  I discussed the limitations of evaluation and management by telemedicine. The patient expressed understanding and agreed to proceed.  Vital Signs: Because this visit was a virtual/telehealth visit, some criteria may be missing or patient reported. Any vitals not documented were not able to be obtained and vitals that have been documented are patient reported.   Allergies (verified) Patient has no known allergies.   History: Past Medical History:  Diagnosis Date   Needle phobia    Squamous cell carcinoma of skin of right lower extremity 01/2016   Urticaria pigmentosa    Past Surgical History:  Procedure Laterality Date   CESAREAN SECTION  1989   COLONOSCOPY  03/28/2012   Family History  Problem Relation Age of Onset   Diabetes Mother    Hypertension Father    Colon cancer Father    Stroke Father    Heart attack Father    Thyroid  disease Sister    Cancer Maternal Grandmother    Stroke Paternal Grandmother    Asthma Son    Healthy Son    Healthy Son    Healthy Son    Alcohol abuse Neg Hx    Heart disease Neg Hx    Hyperlipidemia Neg Hx    Kidney disease Neg Hx    Early death Neg Hx    Stomach cancer Neg Hx    Rectal cancer Neg Hx    Esophageal cancer Neg Hx    Colon polyps Neg Hx    Social History   Occupational History   Not on file  Tobacco Use   Smoking status: Former    Current packs/day: 0.00    Average packs/day: 0.5 packs/day for 3.0 years (1.5 ttl pk-yrs)    Types: Cigarettes    Start date: 12/19/1970     Quit date: 12/19/1973    Years since quitting: 50.9    Passive exposure: Never   Smokeless tobacco: Never  Vaping Use   Vaping status: Never Used  Substance and Sexual Activity   Alcohol use: Yes    Alcohol/week: 14.0 standard drinks of alcohol    Types: 14 Glasses of wine per week   Drug use: No   Sexual activity: Yes    Partners: Male    Birth control/protection: Post-menopausal   Tobacco Counseling Counseling given: Not Answered  SDOH Screenings   Food Insecurity: No Food Insecurity (11/13/2024)  Housing: Unknown (11/13/2024)  Transportation Needs: No Transportation Needs (11/13/2024)  Utilities: Not At Risk (11/13/2024)  Alcohol Screen: Medium Risk (03/05/2024)  Depression (PHQ2-9): Low Risk  (11/13/2024)  Financial Resource Strain: Low Risk  (03/05/2024)  Physical Activity: Sufficiently Active (11/13/2024)  Social Connections: Socially Integrated (11/13/2024)  Stress: No Stress Concern Present (11/13/2024)  Tobacco Use: Medium Risk (11/13/2024)  Health Literacy: Adequate Health Literacy (11/13/2024)   See flowsheets for full screening details  Depression Screen PHQ 2 & 9 Depression Scale- Over the past 2 weeks, how often have you been bothered by any of the following problems? Little interest or pleasure in doing things: 0 Feeling down, depressed, or  hopeless (PHQ Adolescent also includes...irritable): 1 PHQ-2 Total Score: 1 Trouble falling or staying asleep, or sleeping too much: 1 Feeling tired or having little energy: 0 Poor appetite or overeating (PHQ Adolescent also includes...weight loss): 0 Feeling bad about yourself - or that you are a failure or have let yourself or your family down: 0 Trouble concentrating on things, such as reading the newspaper or watching television (PHQ Adolescent also includes...like school work): 0 Moving or speaking so slowly that other people could have noticed. Or the opposite - being so fidgety or restless that you have been  moving around a lot more than usual: 0 Thoughts that you would be better off dead, or of hurting yourself in some way: 0 PHQ-9 Total Score: 2 If you checked off any problems, how difficult have these problems made it for you to do your work, take care of things at home, or get along with other people?: Not difficult at all  Depression Treatment Depression Interventions/Treatment : EYV7-0 Score <4 Follow-up Not Indicated     Goals Addressed               This Visit's Progress     Patient Stated (pt-stated)        Patient stated she plans to continue walking and still works in [employer] / Clinical Intake: Medicare Wellness Visit Type:: Subsequent Annual Wellness Visit Persons participating in visit:: patient Medicare Wellness Visit Mode:: Telephone If telephone:: video declined Because this visit was a virtual/telehealth visit:: pt reported vitals If Telephone or Video please confirm:: I connected with the patient using audio enabled telemedicine application and verified that I am speaking with the correct person using two identifiers; I discussed the limitations of evaluation and management by telemedicine; The patient expressed understanding and agreed to proceed Patient Location:: Home Provider Location:: Office Information given by:: patient Interpreter Needed?: No Pre-visit prep was completed: yes AWV questionnaire completed by patient prior to visit?: no Living arrangements:: lives with spouse/significant other Patient's Overall Health Status Rating: good Typical amount of pain: none Does pain affect daily life?: no Are you currently prescribed opioids?: no  Dietary Habits and Nutritional Risks How many meals a day?: 2 Eats fruit and vegetables daily?: yes Most meals are obtained by: preparing own meals In the last 2 weeks, have you had any of the following?: none Diabetic:: no  Functional Status Activities of Daily Living (to include  ambulation/medication): Independent Ambulation: Independent Medication Administration: Independent Home Management: Independent Manage your own finances?: yes Primary transportation is: driving Concerns about vision?: no *vision screening is required for WTM* Concerns about hearing?: no  Fall Screening Falls in the past year?: 0 Number of falls in past year: 0 Was there an injury with Fall?: 0 Fall Risk Category Calculator: 0 Patient Fall Risk Level: Low Fall Risk  Fall Risk Patient at Risk for Falls Due to: No Fall Risks Fall risk Follow up: Falls evaluation completed; Falls prevention discussed  Home and Transportation Safety: All rugs have non-skid backing?: N/A, no rugs All stairs or steps have railings?: yes Grab bars in the bathtub or shower?: (!) no Have non-skid surface in bathtub or shower?: yes Good home lighting?: yes Regular seat belt use?: yes Hospital stays in the last year:: no  Cognitive Assessment Difficulty concentrating, remembering, or making decisions? : no Will 6CIT or Mini Cog be Completed: yes What year is it?: 0 points What month is it?: 0 points Give patient an  address phrase to remember (5 components): [address] [place], [place] About what time is it?: 0 points Count backwards from 20 to 1: 0 points Say the months of the year in reverse: 0 points  Advance Directives (For Healthcare) Does Patient Have a Medical Advance Directive?: Yes Does patient want to make changes to medical advance directive?: Yes (Inpatient - patient requests chaplain consult to change a medical advance directive) Type of Advance Directive: Healthcare Power of [place]; Living will Copy of Healthcare Power of Attorney in Chart?: No - copy requested Copy of Living Will in Chart?: No - copy requested  Reviewed/Updated  Reviewed/Updated: Reviewed All (Medical, Surgical, Family, Medications, Allergies, Care Teams, Patient Goals)        Objective:    Today's Vitals    11/13/24 1540  Weight: 110 lb (49.9 kg)  Height: 5' 2 (1.575 m)   Body mass index is 20.12 kg/m.  Current Medications (verified) Outpatient Encounter Medications as of 11/13/2024  Medication Sig   Calcium-Magnesium-Vitamin D  (CALCIUM MAGNESIUM PO) Take by mouth daily.   Cholecalciferol  (VITAMIN D3 PO) Take by mouth daily.   Flaxseed, Linseed, (FLAX SEED OIL PO) Take 1 tablespoon in juice daily.   levocetirizine (XYZAL) 5 MG tablet Take 5 mg by mouth every evening.   Multiple Vitamin (MULTIVITAMIN) tablet Take 1 tablet by mouth daily.   No facility-administered encounter medications on file as of 11/13/2024.   Hearing/Vision screen Hearing Screening - Comments:: Denies hearing difficulties   Vision Screening - Comments:: Denies vision concerns Immunizations and Health Maintenance Health Maintenance  Topic Date Due   Zoster Vaccines- Shingrix  (1 of 2) Never done   COVID-19 Vaccine (3 - Pfizer risk series) 09/30/2020   DTaP/Tdap/Td (2 - Td or Tdap) 02/26/2022   Influenza Vaccine  07/19/2024   Colonoscopy  11/13/2025 (Originally 03/28/2017)   Mammogram  03/21/2025   Fecal DNA (Cologuard)  05/05/2025   Medicare Annual Wellness (AWV)  11/13/2025   Pneumococcal Vaccine: 50+ Years  Completed   Bone Density Scan  Completed   Hepatitis C Screening  Completed   Meningococcal B Vaccine  Aged Out        Assessment/Plan:  This is a routine wellness examination for Marijose.  I have recommended that this patient have a immunization for Influenza, Tdap, and Shingles but she declines at this time. I have discussed the risks and benefits of this procedure with her. The patient verbalizes understanding.   Cologuard status: Negative in 04/2022 - ordered by PCP.  Due in 2026.  Pt declined Colonoscopy.  Stated unsure if father actually had Colon Cancer.  Patient Care Team: Joshua Debby CROME, MD as PCP - General (Internal Medicine)  I have personally reviewed and noted the following in the  patient's chart:   Medical and social history Use of alcohol, tobacco or illicit drugs  Current medications and supplements including opioid prescriptions. Functional ability and status Nutritional status Physical activity Advanced directives List of other physicians Hospitalizations, surgeries, and ER visits in previous 12 months Vitals Screenings to include cognitive, depression, and falls Referrals and appointments  No orders of the defined types were placed in this encounter.  In addition, I have reviewed and discussed with patient certain preventive protocols, quality metrics, and best practice recommendations. A written personalized care plan for preventive services as well as general preventive health recommendations were provided to patient.   Verdie CHRISTELLA Saba, CMA   11/13/2024   Return in 1 year (on 11/13/2025).  After Visit Summary: (  MyChart) Due to this being a telephonic visit, the after visit summary with patients personalized plan was offered to patient via MyChart   Nurse Notes: Scheduled CPE w/PCP fo 02/2025.

## 2024-11-13 NOTE — Patient Instructions (Signed)
 Erin Kim,  Thank you for taking the time for your Medicare Wellness Visit. I appreciate your continued commitment to your health goals. Please review the care plan we discussed, and feel free to reach out if I can assist you further.  Please note that Annual Wellness Visits do not include a physical exam. Some assessments may be limited, especially if the visit was conducted virtually. If needed, we may recommend an in-person follow-up with your provider.  Ongoing Care Seeing your primary care provider every 3 to 6 months helps us  monitor your health and provide consistent, personalized care.   Referrals If a referral was made during today's visit and you haven't received any updates within two weeks, please contact the referred provider directly to check on the status.  Recommended Screenings:  Health Maintenance  Topic Date Due   Zoster (Shingles) Vaccine (1 of 2) Never done   Colon Cancer Screening  03/28/2017   DTaP/Tdap/Td vaccine (2 - Td or Tdap) 02/26/2022   Flu Shot  07/19/2024   COVID-19 Vaccine (4 - 2025-26 season) 08/19/2024   Breast Cancer Screening  03/21/2025   Medicare Annual Wellness Visit  11/13/2025   Pneumococcal Vaccine for age over 15  Completed   Osteoporosis screening with Bone Density Scan  Completed   Hepatitis C Screening  Completed   Meningitis B Vaccine  Aged Out       11/13/2024    3:42 PM  Advanced Directives  Does Patient Have a Medical Advance Directive? Yes  Type of Estate Agent of [place];Living will  Does patient want to make changes to medical advance directive? Yes (Inpatient - patient requests chaplain consult to change a medical advance directive)  Copy of Healthcare Power of Attorney in Chart? No - copy requested    Vision: Annual vision screenings are recommended for early detection of glaucoma, cataracts, and diabetic retinopathy. These exams can also reveal signs of chronic conditions such as diabetes and high  blood pressure.  Dental: Annual dental screenings help detect early signs of oral cancer, gum disease, and other conditions linked to overall health, including heart disease and diabetes.

## 2024-12-24 ENCOUNTER — Other Ambulatory Visit

## 2024-12-24 ENCOUNTER — Ambulatory Visit (HOSPITAL_BASED_OUTPATIENT_CLINIC_OR_DEPARTMENT_OTHER)
Admission: RE | Admit: 2024-12-24 | Discharge: 2024-12-24 | Disposition: A | Discharge status: Home / Self Care | Source: Ambulatory Visit | Attending: Internal Medicine | Admitting: Internal Medicine

## 2024-12-24 ENCOUNTER — Ambulatory Visit: Payer: Self-pay | Admitting: Internal Medicine

## 2024-12-24 DIAGNOSIS — M810 Age-related osteoporosis without current pathological fracture: Secondary | ICD-10-CM

## 2024-12-24 DIAGNOSIS — E559 Vitamin D deficiency, unspecified: Secondary | ICD-10-CM | POA: Diagnosis present

## 2024-12-24 DIAGNOSIS — E2839 Other primary ovarian failure: Secondary | ICD-10-CM | POA: Insufficient documentation

## 2025-01-09 ENCOUNTER — Encounter: Payer: Self-pay | Admitting: Physician Assistant

## 2025-01-09 ENCOUNTER — Ambulatory Visit: Admitting: Physician Assistant

## 2025-01-09 DIAGNOSIS — M810 Age-related osteoporosis without current pathological fracture: Secondary | ICD-10-CM | POA: Diagnosis not present

## 2025-01-09 MED ORDER — ROMOSOZUMAB-AQQG 105 MG/1.17ML ~~LOC~~ SOSY: 210.0000 mg | PREFILLED_SYRINGE | Freq: Once | SUBCUTANEOUS | Status: AC

## 2025-01-09 NOTE — Progress Notes (Signed)
 "  Office Visit Note   Patient: Erin Kim           Date of Birth: 17-Jun-1951           MRN: [id number] Visit Date: 01/09/2025              Requested by: Joshua Debby CROME, MD [address] [place],  [place] [address] PCP: Joshua Debby CROME, MD   Assessment & Plan: Visit Diagnoses:  1. Age-related osteoporosis without current pathological fracture     Plan: Patient is a pleasant 74 year old woman who is referred for evaluation of osteoporosis by Dr. Joshua.  She is extremely active and still works full-time.  She is not currently taking any medications for osteoporosis.  She has never had a fracture in her hip spine or wrist.  She has no history of heart disease no history of cancer no history of kidney disease no history of ulcers or gastric bypass.  She has no history of reflux.  No history of epilepsy or seizures.  She does take a multivitamin but not supplementing with calcium because she had concerns of its effects on osteoarthritis..  She is quite active with both strength resistance training and walking.  She drinks 1 beverage average weekly she does not currently smoke.  No history is of difficulties with dental issues no history of spine or hip fracture in her family.  Her BMI is 19.8.  Unfortunately she has been having bone density scans.  The last 2 do bone densities show a -3.4 in her lumbar spine.  She has also gone from a -3.8 to a -4.6 for her T-score in her distal radius.  We talked about options and different medication she said that Evenity  was suggested to her once.  She would also be a good candidate for Tymlos but she is not interested in daily injections.  I reviewed the risks with Evenity  and gave her information about the medication.  We will try to authorize for this.  If not she is willing to consider Prolia /Jubbonti.  About a year ago she was vitamin D  deficient and was placed on a megadose.  Her vitamin D  then improved so she discontinued it.  She would like to  know cost before she consents to the medication.  As far as her calcium intake she is willing to track her calcium and I told her the importance of maintaining this especially if we go forward with treatment.  If she decides to go forward with treatment also we will have to draw some baseline labs prior to her beginning the medication.  She would like to see her benefits prior to getting this blood work  Follow-Up Instructions: No follow-ups on file.   Orders:  No orders of the defined types were placed in this encounter.  No orders of the defined types were placed in this encounter.     Procedures: No procedures performed   Clinical Data: No additional findings.   Subjective: No chief complaint on file.   HPI patient is a pleasant 74 year old woman referred for evaluation of osteoporosis by Dr. Joshua  Review of Systems  All other systems reviewed and are negative.    Objective: Vital Signs: LMP 12/19/2001   Physical Exam Constitutional:      Appearance: Normal appearance.  Pulmonary:     Effort: Pulmonary effort is normal.  Skin:    General: Skin is warm and dry.  Neurological:     General: No focal deficit  present.     Mental Status: She is alert and oriented to person, place, and time.  Psychiatric:        Mood and Affect: Mood normal.       Specialty Comments:  No specialty comments available.  Imaging: No results found.   PMFS History: Patient Active Problem List   Diagnosis Date Noted   Encounter for general adult medical examination with abnormal findings 03/08/2024   Need for prophylactic vaccination with combined diphtheria-tetanus-pertussis (DTP) vaccine 03/08/2024   Primary hypertension 03/07/2024   Screening for colon cancer 03/07/2024   Age-related osteoporosis without current pathological fracture 03/18/2022   Estrogen deficiency 04/14/2021   Hyperlipidemia LDL goal <130 02/26/2019   Urticaria pigmentosa, adult form 02/26/2019   B12  deficiency 02/26/2019   Vitamin D  deficiency 02/26/2019   Visit for screening mammogram 02/27/2012   Past Medical History:  Diagnosis Date   Needle phobia    Squamous cell carcinoma of skin of right lower extremity 01/2016   Urticaria pigmentosa     Family History  Problem Relation Age of Onset   Diabetes Mother    Hypertension Father    Colon cancer Father    Stroke Father    Heart attack Father    Thyroid  disease Sister    Cancer Maternal Grandmother    Stroke Paternal Grandmother    Asthma Son    Healthy Son    Healthy Son    Healthy Son    Alcohol abuse Neg Hx    Heart disease Neg Hx    Hyperlipidemia Neg Hx    Kidney disease Neg Hx    Early death Neg Hx    Stomach cancer Neg Hx    Rectal cancer Neg Hx    Esophageal cancer Neg Hx    Colon polyps Neg Hx     Past Surgical History:  Procedure Laterality Date   CESAREAN SECTION  1989   COLONOSCOPY  03/28/2012   Social History   Occupational History   Not on file  Tobacco Use   Smoking status: Former    Current packs/day: 0.00    Average packs/day: 0.5 packs/day for 3.0 years (1.5 ttl pk-yrs)    Types: Cigarettes    Start date: 12/19/1970    Quit date: 12/19/1973    Years since quitting: 51.0    Passive exposure: Never   Smokeless tobacco: Never  Vaping Use   Vaping status: Never Used  Substance and Sexual Activity   Alcohol use: Yes    Alcohol/week: 14.0 standard drinks of alcohol    Types: 14 Glasses of wine per week   Drug use: No   Sexual activity: Yes    Partners: Male    Birth control/protection: Post-menopausal        "

## 2025-01-16 ENCOUNTER — Encounter: Admitting: Physician Assistant

## 2025-01-22 ENCOUNTER — Other Ambulatory Visit: Payer: Self-pay | Admitting: Radiology

## 2025-01-22 DIAGNOSIS — M810 Age-related osteoporosis without current pathological fracture: Secondary | ICD-10-CM

## 2025-01-22 MED ORDER — DENOSUMAB-BBDZ 60 MG/ML ~~LOC~~ SOSY: 60.0000 mg | PREFILLED_SYRINGE | Freq: Once | SUBCUTANEOUS | Status: AC

## 2025-03-10 ENCOUNTER — Encounter: Admitting: Internal Medicine

## 2025-11-18 ENCOUNTER — Ambulatory Visit

## 2025-11-24 ENCOUNTER — Ambulatory Visit
